# Patient Record
Sex: Male | Born: 1965 | Race: Black or African American | Hispanic: No | Marital: Single | Smoking: Current some day smoker
Health system: Southern US, Community
[De-identification: ages and names within clinical notes are randomized; demographics above are authoritative.]

## PROBLEM LIST (undated history)

## (undated) DIAGNOSIS — R51 Headache: Secondary | ICD-10-CM

## (undated) DIAGNOSIS — J45909 Unspecified asthma, uncomplicated: Secondary | ICD-10-CM

## (undated) DIAGNOSIS — G4730 Sleep apnea, unspecified: Secondary | ICD-10-CM

## (undated) HISTORY — PX: SHOULDER ARTHROSCOPY: SHX128

---

## 1998-05-10 ENCOUNTER — Emergency Department (HOSPITAL_COMMUNITY): Admission: EM | Admit: 1998-05-10 | Discharge: 1998-05-10 | Payer: Self-pay | Admitting: Emergency Medicine

## 1998-05-10 ENCOUNTER — Encounter: Payer: Self-pay | Admitting: Emergency Medicine

## 1998-05-11 ENCOUNTER — Encounter: Admission: RE | Admit: 1998-05-11 | Discharge: 1998-08-09 | Payer: Self-pay | Admitting: *Deleted

## 1998-06-06 ENCOUNTER — Encounter: Admission: RE | Admit: 1998-06-06 | Discharge: 1998-09-04 | Payer: Self-pay | Admitting: *Deleted

## 1999-09-10 ENCOUNTER — Emergency Department (HOSPITAL_COMMUNITY): Admission: EM | Admit: 1999-09-10 | Discharge: 1999-09-10 | Payer: Self-pay

## 1999-09-24 ENCOUNTER — Emergency Department (HOSPITAL_COMMUNITY): Admission: EM | Admit: 1999-09-24 | Discharge: 1999-09-25 | Payer: Self-pay | Admitting: Emergency Medicine

## 1999-10-08 ENCOUNTER — Emergency Department (HOSPITAL_COMMUNITY): Admission: EM | Admit: 1999-10-08 | Discharge: 1999-10-08 | Payer: Self-pay | Admitting: Emergency Medicine

## 1999-10-08 ENCOUNTER — Encounter: Payer: Self-pay | Admitting: Emergency Medicine

## 1999-10-09 ENCOUNTER — Emergency Department (HOSPITAL_COMMUNITY): Admission: EM | Admit: 1999-10-09 | Discharge: 1999-10-09 | Payer: Self-pay | Admitting: *Deleted

## 1999-10-25 ENCOUNTER — Emergency Department (HOSPITAL_COMMUNITY): Admission: EM | Admit: 1999-10-25 | Discharge: 1999-10-25 | Payer: Self-pay | Admitting: Emergency Medicine

## 1999-10-25 ENCOUNTER — Encounter: Payer: Self-pay | Admitting: Emergency Medicine

## 2000-09-09 ENCOUNTER — Emergency Department (HOSPITAL_COMMUNITY): Admission: EM | Admit: 2000-09-09 | Discharge: 2000-09-09 | Payer: Self-pay | Admitting: Emergency Medicine

## 2001-06-04 ENCOUNTER — Emergency Department (HOSPITAL_COMMUNITY): Admission: EM | Admit: 2001-06-04 | Discharge: 2001-06-04 | Payer: Self-pay | Admitting: Emergency Medicine

## 2001-06-04 ENCOUNTER — Encounter: Payer: Self-pay | Admitting: Emergency Medicine

## 2002-08-26 ENCOUNTER — Emergency Department (HOSPITAL_COMMUNITY): Admission: EM | Admit: 2002-08-26 | Discharge: 2002-08-27 | Payer: Self-pay | Admitting: Emergency Medicine

## 2003-12-09 ENCOUNTER — Emergency Department (HOSPITAL_COMMUNITY): Admission: EM | Admit: 2003-12-09 | Discharge: 2003-12-09 | Payer: Self-pay | Admitting: Emergency Medicine

## 2004-06-18 ENCOUNTER — Emergency Department (HOSPITAL_COMMUNITY): Admission: EM | Admit: 2004-06-18 | Discharge: 2004-06-18 | Payer: Self-pay | Admitting: Emergency Medicine

## 2004-10-02 ENCOUNTER — Emergency Department (HOSPITAL_COMMUNITY): Admission: EM | Admit: 2004-10-02 | Discharge: 2004-10-02 | Payer: Self-pay | Admitting: Emergency Medicine

## 2005-12-17 ENCOUNTER — Emergency Department (HOSPITAL_COMMUNITY): Admission: EM | Admit: 2005-12-17 | Discharge: 2005-12-17 | Payer: Self-pay | Admitting: Emergency Medicine

## 2010-02-09 ENCOUNTER — Ambulatory Visit (HOSPITAL_COMMUNITY): Admission: RE | Admit: 2010-02-09 | Discharge: 2010-02-09 | Payer: Self-pay | Admitting: Orthopedic Surgery

## 2010-08-06 ENCOUNTER — Emergency Department (HOSPITAL_COMMUNITY)
Admission: EM | Admit: 2010-08-06 | Discharge: 2010-08-06 | Disposition: A | Discharge status: Home / Self Care | Payer: Worker's Compensation | Attending: Emergency Medicine | Admitting: Emergency Medicine

## 2010-08-06 ENCOUNTER — Emergency Department (HOSPITAL_COMMUNITY): Payer: Worker's Compensation

## 2010-08-06 DIAGNOSIS — M25419 Effusion, unspecified shoulder: Secondary | ICD-10-CM | POA: Insufficient documentation

## 2010-08-06 DIAGNOSIS — IMO0002 Reserved for concepts with insufficient information to code with codable children: Secondary | ICD-10-CM | POA: Insufficient documentation

## 2010-08-06 DIAGNOSIS — M25519 Pain in unspecified shoulder: Secondary | ICD-10-CM | POA: Insufficient documentation

## 2010-08-06 DIAGNOSIS — M67919 Unspecified disorder of synovium and tendon, unspecified shoulder: Secondary | ICD-10-CM | POA: Insufficient documentation

## 2010-08-06 DIAGNOSIS — M719 Bursopathy, unspecified: Secondary | ICD-10-CM | POA: Insufficient documentation

## 2010-08-06 DIAGNOSIS — Y990 Civilian activity done for income or pay: Secondary | ICD-10-CM | POA: Insufficient documentation

## 2010-08-06 DIAGNOSIS — S46909A Unspecified injury of unspecified muscle, fascia and tendon at shoulder and upper arm level, unspecified arm, initial encounter: Secondary | ICD-10-CM | POA: Insufficient documentation

## 2010-08-06 DIAGNOSIS — S4980XA Other specified injuries of shoulder and upper arm, unspecified arm, initial encounter: Secondary | ICD-10-CM | POA: Insufficient documentation

## 2010-08-06 DIAGNOSIS — M25619 Stiffness of unspecified shoulder, not elsewhere classified: Secondary | ICD-10-CM | POA: Insufficient documentation

## 2010-09-26 ENCOUNTER — Encounter (HOSPITAL_BASED_OUTPATIENT_CLINIC_OR_DEPARTMENT_OTHER)
Admission: RE | Admit: 2010-09-26 | Discharge: 2010-09-26 | Disposition: A | Discharge status: Home / Self Care | Payer: Self-pay | Source: Ambulatory Visit | Attending: Orthopedic Surgery | Admitting: Orthopedic Surgery

## 2010-10-02 ENCOUNTER — Other Ambulatory Visit: Payer: Self-pay | Admitting: Orthopedic Surgery

## 2010-10-02 ENCOUNTER — Ambulatory Visit (HOSPITAL_BASED_OUTPATIENT_CLINIC_OR_DEPARTMENT_OTHER)
Admission: RE | Admit: 2010-10-02 | Discharge: 2010-10-03 | Disposition: A | Discharge status: Home / Self Care | Payer: Worker's Compensation | Source: Ambulatory Visit | Attending: Orthopedic Surgery | Admitting: Orthopedic Surgery

## 2010-10-02 DIAGNOSIS — Z01812 Encounter for preprocedural laboratory examination: Secondary | ICD-10-CM | POA: Insufficient documentation

## 2010-10-02 DIAGNOSIS — Z5333 Arthroscopic surgical procedure converted to open procedure: Secondary | ICD-10-CM | POA: Insufficient documentation

## 2010-10-02 DIAGNOSIS — Z01810 Encounter for preprocedural cardiovascular examination: Secondary | ICD-10-CM | POA: Insufficient documentation

## 2010-10-02 DIAGNOSIS — M719 Bursopathy, unspecified: Secondary | ICD-10-CM | POA: Insufficient documentation

## 2010-10-02 DIAGNOSIS — M67919 Unspecified disorder of synovium and tendon, unspecified shoulder: Secondary | ICD-10-CM | POA: Insufficient documentation

## 2010-10-02 LAB — POCT HEMOGLOBIN-HEMACUE: Hemoglobin: 14.1 g/dL (ref 13.0–17.0)

## 2010-10-04 NOTE — Op Note (Signed)
NAMEBILL, YOHN               ACCOUNT NO.:  [id number]  MEDICAL RECORD NO.:  [id number]           PATIENT TYPE:  E  LOCATION:  [facility]                         FACILITY:  [facility]  PHYSICIAN:  Katy Fitch. Makar Slatter, M.D. DATE OF BIRTH:  1965/12/10  DATE OF PROCEDURE:  10/02/2010 DATE OF DISCHARGE:  08/06/2010                              OPERATIVE REPORT   PREOPERATIVE DIAGNOSIS:  Chronic pain, right shoulder following injury, June 2011, with prior MRI documenting significant partial articular surface tear of right supraspinatus, a probable superior labrum anterior and posterior lesion, a marked stress reaction in the greater tuberosity and an unfavorable type 3 acromion.  POSTOPERATIVE DIAGNOSIS:  Chronic pain, right shoulder following injury, June 2011, with prior MRI documenting significant partial articular surface tear of right supraspinatus, a probable superior labrum anterior and posterior lesion, a marked stress reaction in the greater tuberosity and an unfavorable type 3 acromion.  Confirmation of type 2 superior labrum anterior and posterior lesion and instability of biceps origin and anterior superior glenohumeral ligament origin, full-thickness rotator cuff tear of central supraspinatus and partial articular surface tear of anterior supraspinatus and anterior infraspinatus with type 3 acromion.  OPERATIONS: 1. Examination of right shoulder under anesthesia confirming absence     of adhesive capsulitis. 2. Diagnostic arthroscopy of right shoulder confirming type 2 SLAP     lesion with frank instability of biceps origin, instability of     anterior superior glenohumeral ligament, moderate synovitis and the     aforementioned partial articular surface tendon avulsion with a     full-thickness tear of the supraspinatus. 3. Arthroscopic debridement of supraspinatus, infraspinatus, biceps     tenotomy. 4. Arthroscopic subacromial decompression with bursectomy,  coracoacromial ligament relaxation, and acromioplasty and     examination of the [facility] joint. 5. Open reconstruction of rotator cuff with a pair of supraspinatus     and infraspinatus to decorticated greater tuberosity with biopsy of     inflammatory area of greater tuberosity with bone erosion. 6. Open tenodesis of long head of biceps with suture to tissues at     bicipital groove and rotator cuff.  OPERATING SURGEON:  Katy Fitch. Winola Drum, MD  ASSISTANT:  Marveen Reeks Dasnoit, PA-C  ANESTHESIA:  General endotracheal supplemented by right interscalene block.  SUPERVISING ANESTHESIOLOGIST:  Kaylyn Layer. 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  He had had more than 9 months of treatment following an on-the-job injury working on his truck.  He had chronic pain and had findings compatible with a neck strain and a shoulder strain.  He was value by Dr. Darrelyn Hillock of [facility].  He had imaging of the shoulder which documented significant rotator cuff tendinopathy.  He was rehabilitated and released by Dr. Darrelyn Hillock with an impairment rating.  He had another injury to the shoulder in February 2012.  An alternative orthopedic opinion was requested.  Clinical examination suggested a significant rotator cuff injury, possible SLAP lesion and difficulties with impingement-type symptoms.  We advised that it would be in  Mr. Allston best interest to undergo diagnostic arthroscopy of the shoulder anticipating that he would have a possible SLAP lesion and a rotator cuff tear.  His preoperative imaging revealed a very substantial partial articular surface tear of the supraspinatus and an erosive lesion in the greater tuberosity with suggestion of a cyst in the greater tuberosity.  We anticipated subacromial decompression, repair of the rotator cuff and possible biceps  tenodesis.  Questions regarding the surgery were invited and answered in detail preoperatively.  PROCEDURE:  Lorena Clearman was interviewed by Dr. Michelle Piper in the holding area.  After detailed informed consent, a ropivacaine right interscalene block was placed without complication.  Mr. Daniel was brought to room #6 of the [facility] and placed in supine position on the operating table.  Under Dr. Deirdre Priest direct supervision, general endotracheal anesthesia was induced.  Mr. Danielski was positioned in the beach-chair position with aid of a torso and head holder designed from shoulder arthroscopy.  The entire right upper extremity and forequarter were prepped with DuraPrep and draped with impervious arthroscopy drapes.  The procedure commenced with examination of the shoulder under anesthesia.  This confirmed the absence of adhesive capsulitis.  A routine surgical time-out was accomplished followed by instrumentation of the rotation shoulder through a standard posterior viewing portal with a blunt trocar.  Diagnostic arthroscopy confirmed the presence of a type 2 SLAP lesion with a positive peel-back sign greater than 1 cm. The anterior superior glenohumeral ligament origin was completely unstable.  The middle glenohumeral ligament and inferior glenohumeral ligament were intact.  The deep surface of the rotator cuff revealed a significant pars lesion of more than 50% of the anterior supraspinatus, 100% of the mid supraspinatus and 50% of the anterior infraspinatus. This was documented with digital camera followed by use of a suction shaver to debride this to a stable margin.  We elected to perform a biceps tenodesis.  Therefore, the biceps was released 95% at its origin of superior labrum with a suction shaver followed by removal of the arthroscopic equipment and instrumentation of the subacromial space. The bursa was hypertrophic.  A suction shaver was used to debride  bursa to allowing visualization of the [facility] joint.  The distal clavicle was not prominent.  There was some synovitis present, that was debrided.  There was a type 3 acromion.  The coracoacromial ligament was released with cutting cautery followed by use of a suction shaver to level the acromion to type 1 morphology.  The cuff had a small full-thickness tear visualized.  At this point, hemostasis was achieved followed by removal of the arthroscopic equipment.  A 5-cm anterior middle third deltoid muscle splitting incision was accomplished followed by bursectomy.  The tear was easily identified.  A 3.5-cm insertion of the supraspinatus and anterior infraspinatus was elevated, the necrotic tendon debrided, a bur was used to decorticate the greater tuberosity down to bleeding surface. The biceps was identified and tenodesis to the surrounding rotator cuff and intertubercular ligament with a series of mattress sutures of #2 FiberWire knots buried.  The rotator cuff was advanced laterally to an anatomic position and repaired with a central fiber tape and a pair of mattress sutures with a medial corkscrew creating an anatomic footprint.  The repair was completed with lateral swivel lock.  A low profile repair was achieved. A hand rasp was used to feather the lateral margin of the acromion followed by hemostasis.  The deltoid split was repaired with simple suture of 0 Vicryl  followed by repair of the skin with subcutaneous 0 and 2-0 Vicryl and intradermal 3-0 Prolene.  Attention was then directed to re-scoping in the shoulder joint.  The scope was then the reintroduced allowing visualization of the complete repair.  An anatomic footprint was achieved followed by completion of the biceps tenotomy.  The stump of the biceps was debrided to the margin of the rotator cuff with a 5.5-mm suction shaver.  There were no apparent complications.  Mr. Ong tolerated the surgery and anesthesia well.   He was placed in a sling, transferred to the recovery room with stable signs with anticipation of an admission to recovery care center for observation his vital signs and appropriate pain management with p.o. and IV Dilaudid as well as IV Ancef 1 g IV q.8 h x3 doses as a prophylactic antibiotic.     Katy Fitch Mustapha Colson, M.D.     RVS/MEDQ  D:  10/02/2010  T:  10/03/2010  Job:  [id number]  Electronically Signed by Josephine Igo M.D. on 10/04/2010 12:13:52 PM

## 2011-05-29 ENCOUNTER — Encounter (HOSPITAL_BASED_OUTPATIENT_CLINIC_OR_DEPARTMENT_OTHER): Payer: Self-pay | Admitting: *Deleted

## 2011-05-29 NOTE — Progress Notes (Signed)
Pt waiting to see if work comp going to pay-this is his 3 surg in a yr

## 2011-05-30 ENCOUNTER — Other Ambulatory Visit: Payer: Self-pay | Admitting: Orthopedic Surgery

## 2011-06-04 ENCOUNTER — Encounter (HOSPITAL_BASED_OUTPATIENT_CLINIC_OR_DEPARTMENT_OTHER): Admission: RE | Payer: Self-pay | Source: Ambulatory Visit

## 2011-06-04 ENCOUNTER — Ambulatory Visit (HOSPITAL_BASED_OUTPATIENT_CLINIC_OR_DEPARTMENT_OTHER)
Admission: RE | Admit: 2011-06-04 | Payer: Worker's Compensation | Source: Ambulatory Visit | Admitting: Orthopedic Surgery

## 2011-06-04 HISTORY — DX: Headache: R51

## 2011-06-04 SURGERY — ARTHROSCOPY, SHOULDER
Anesthesia: General | Laterality: Right

## 2011-12-17 ENCOUNTER — Encounter (HOSPITAL_COMMUNITY): Payer: Self-pay | Admitting: Emergency Medicine

## 2011-12-17 ENCOUNTER — Emergency Department (HOSPITAL_COMMUNITY)
Admission: EM | Admit: 2011-12-17 | Discharge: 2011-12-18 | Discharge status: Against Medical Advice | Payer: No Typology Code available for payment source | Attending: Emergency Medicine | Admitting: Emergency Medicine

## 2011-12-17 DIAGNOSIS — M542 Cervicalgia: Secondary | ICD-10-CM | POA: Insufficient documentation

## 2011-12-17 DIAGNOSIS — R51 Headache: Secondary | ICD-10-CM | POA: Insufficient documentation

## 2011-12-17 DIAGNOSIS — Z043 Encounter for examination and observation following other accident: Secondary | ICD-10-CM | POA: Insufficient documentation

## 2011-12-17 DIAGNOSIS — M25519 Pain in unspecified shoulder: Secondary | ICD-10-CM | POA: Insufficient documentation
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
## 2011-12-18 ENCOUNTER — Emergency Department (HOSPITAL_COMMUNITY): Payer: No Typology Code available for payment source

## 2011-12-18 ENCOUNTER — Emergency Department (HOSPITAL_COMMUNITY)
Admission: EM | Admit: 2011-12-18 | Discharge: 2011-12-18 | Disposition: A | Discharge status: Home / Self Care | Payer: No Typology Code available for payment source | Attending: Emergency Medicine | Admitting: Emergency Medicine

## 2011-12-18 ENCOUNTER — Encounter (HOSPITAL_COMMUNITY): Payer: Self-pay | Admitting: Emergency Medicine

## 2011-12-18 DIAGNOSIS — M542 Cervicalgia: Secondary | ICD-10-CM | POA: Insufficient documentation

## 2011-12-18 DIAGNOSIS — S139XXA Sprain of joints and ligaments of unspecified parts of neck, initial encounter: Secondary | ICD-10-CM | POA: Insufficient documentation

## 2011-12-18 DIAGNOSIS — S134XXA Sprain of ligaments of cervical spine, initial encounter: Secondary | ICD-10-CM

## 2011-12-18 DIAGNOSIS — S43109A Unspecified dislocation of unspecified acromioclavicular joint, initial encounter: Secondary | ICD-10-CM | POA: Insufficient documentation

## 2011-12-18 DIAGNOSIS — F17200 Nicotine dependence, unspecified, uncomplicated: Secondary | ICD-10-CM | POA: Insufficient documentation

## 2011-12-18 DIAGNOSIS — Z8739 Personal history of other diseases of the musculoskeletal system and connective tissue: Secondary | ICD-10-CM | POA: Insufficient documentation

## 2011-12-18 DIAGNOSIS — Y92410 Unspecified street and highway as the place of occurrence of the external cause: Secondary | ICD-10-CM | POA: Insufficient documentation

## 2011-12-18 MED ORDER — DIAZEPAM 5 MG PO TABS
5.0000 mg | ORAL_TABLET | Freq: Once | ORAL | Status: AC
  Administered 2011-12-18: 5 mg via ORAL
  Filled 2011-12-18: qty 1

## 2011-12-18 MED ORDER — DIAZEPAM 5 MG PO TABS: 5.0000 mg | ORAL_TABLET | Freq: Three times a day (TID) | ORAL | Status: AC | PRN

## 2011-12-18 MED ORDER — HYDROCODONE-ACETAMINOPHEN 5-325 MG PO TABS: 1.0000 | ORAL_TABLET | ORAL | Status: AC | PRN

## 2011-12-18 MED ORDER — NAPROXEN 375 MG PO TABS: 375.0000 mg | ORAL_TABLET | Freq: Two times a day (BID) | ORAL | Status: DC

## 2011-12-18 NOTE — ED Notes (Signed)
MD at bedside. 

## 2011-12-18 NOTE — ED Provider Notes (Signed)
Medical screening examination/treatment/procedure(s) were performed by non-physician practitioner and as supervising physician I was immediately available for consultation/collaboration.  Ethelda Chick, MD 12/18/11 2135

## 2011-12-18 NOTE — ED Notes (Addendum)
Pt stated that he has to be at work at 6 and cant wait any longer. Pt decided to leave.
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
## 2012-02-13 ENCOUNTER — Encounter (HOSPITAL_COMMUNITY): Payer: Self-pay | Admitting: Emergency Medicine

## 2012-02-13 ENCOUNTER — Emergency Department (HOSPITAL_COMMUNITY)
Admission: EM | Admit: 2012-02-13 | Discharge: 2012-02-13 | Disposition: A | Discharge status: Home / Self Care | Payer: No Typology Code available for payment source | Attending: Emergency Medicine | Admitting: Emergency Medicine

## 2012-02-13 DIAGNOSIS — M25519 Pain in unspecified shoulder: Secondary | ICD-10-CM | POA: Insufficient documentation

## 2012-02-13 MED ORDER — OXYCODONE-ACETAMINOPHEN 5-325 MG PO TABS
1.0000 | ORAL_TABLET | Freq: Once | ORAL | Status: AC
  Administered 2012-02-13: 1 via ORAL
  Filled 2012-02-13: qty 1

## 2012-02-13 MED ORDER — OXYCODONE-ACETAMINOPHEN 5-325 MG PO TABS: ORAL_TABLET | ORAL | Status: DC

## 2012-02-13 NOTE — ED Provider Notes (Signed)
Medical screening examination/treatment/procedure(s) were performed by non-physician practitioner and as supervising physician I was immediately available for consultation/collaboration.   Gwyneth Sprout, MD 02/13/12 [phone]
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
## 2012-03-05 ENCOUNTER — Emergency Department (HOSPITAL_COMMUNITY)
Admission: EM | Admit: 2012-03-05 | Discharge: 2012-03-05 | Discharge status: Against Medical Advice | Payer: No Typology Code available for payment source

## 2012-03-11 ENCOUNTER — Other Ambulatory Visit: Payer: Self-pay | Admitting: Orthopedic Surgery

## 2012-03-11 ENCOUNTER — Other Ambulatory Visit (HOSPITAL_COMMUNITY): Payer: Self-pay | Admitting: Orthopedic Surgery

## 2012-03-11 DIAGNOSIS — R609 Edema, unspecified: Secondary | ICD-10-CM

## 2012-03-11 DIAGNOSIS — M25511 Pain in right shoulder: Secondary | ICD-10-CM

## 2012-03-14 ENCOUNTER — Ambulatory Visit (HOSPITAL_COMMUNITY)
Admission: RE | Admit: 2012-03-14 | Discharge: 2012-03-14 | Disposition: A | Discharge status: Home / Self Care | Payer: Self-pay | Source: Ambulatory Visit | Attending: Orthopedic Surgery | Admitting: Orthopedic Surgery

## 2012-03-14 DIAGNOSIS — M8560 Other cyst of bone, unspecified site: Secondary | ICD-10-CM | POA: Insufficient documentation

## 2012-03-14 DIAGNOSIS — M25519 Pain in unspecified shoulder: Secondary | ICD-10-CM | POA: Insufficient documentation

## 2012-03-14 DIAGNOSIS — M25511 Pain in right shoulder: Secondary | ICD-10-CM

## 2012-03-14 DIAGNOSIS — M6740 Ganglion, unspecified site: Secondary | ICD-10-CM | POA: Insufficient documentation

## 2012-03-14 DIAGNOSIS — R609 Edema, unspecified: Secondary | ICD-10-CM

## 2012-03-25 ENCOUNTER — Other Ambulatory Visit: Payer: Self-pay | Admitting: Orthopedic Surgery

## 2012-03-31 ENCOUNTER — Encounter (HOSPITAL_BASED_OUTPATIENT_CLINIC_OR_DEPARTMENT_OTHER): Payer: Self-pay | Admitting: *Deleted
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
## 2012-04-03 ENCOUNTER — Ambulatory Visit (HOSPITAL_BASED_OUTPATIENT_CLINIC_OR_DEPARTMENT_OTHER)
Admission: RE | Admit: 2012-04-03 | Discharge: 2012-04-03 | Disposition: A | Discharge status: Home / Self Care | Payer: No Typology Code available for payment source | Source: Ambulatory Visit | Attending: Orthopedic Surgery | Admitting: Orthopedic Surgery

## 2012-04-03 ENCOUNTER — Ambulatory Visit (HOSPITAL_BASED_OUTPATIENT_CLINIC_OR_DEPARTMENT_OTHER): Payer: No Typology Code available for payment source | Admitting: Anesthesiology

## 2012-04-03 ENCOUNTER — Encounter (HOSPITAL_BASED_OUTPATIENT_CLINIC_OR_DEPARTMENT_OTHER): Payer: Self-pay | Admitting: Anesthesiology

## 2012-04-03 ENCOUNTER — Encounter (HOSPITAL_BASED_OUTPATIENT_CLINIC_OR_DEPARTMENT_OTHER)
Admission: RE | Disposition: A | Discharge status: Home / Self Care | Payer: Self-pay | Source: Ambulatory Visit | Attending: Orthopedic Surgery

## 2012-04-03 ENCOUNTER — Encounter (HOSPITAL_BASED_OUTPATIENT_CLINIC_OR_DEPARTMENT_OTHER): Payer: Self-pay | Admitting: *Deleted

## 2012-04-03 DIAGNOSIS — M67439 Ganglion, unspecified wrist: Secondary | ICD-10-CM

## 2012-04-03 DIAGNOSIS — M6740 Ganglion, unspecified site: Secondary | ICD-10-CM | POA: Insufficient documentation

## 2012-04-03 DIAGNOSIS — M19019 Primary osteoarthritis, unspecified shoulder: Secondary | ICD-10-CM | POA: Insufficient documentation

## 2012-04-03 DIAGNOSIS — G4730 Sleep apnea, unspecified: Secondary | ICD-10-CM | POA: Insufficient documentation

## 2012-04-03 HISTORY — PX: SHOULDER OPEN ROTATOR CUFF REPAIR: SHX2407

## 2012-04-03 HISTORY — DX: Sleep apnea, unspecified: G47.30

## 2012-04-03 HISTORY — PX: GANGLION CYST EXCISION: SHX1691

## 2012-04-03 LAB — POCT HEMOGLOBIN-HEMACUE: Hemoglobin: 14.4 g/dL (ref 13.0–17.0)

## 2012-04-03 SURGERY — REPAIR, ROTATOR CUFF, OPEN
Anesthesia: General | Site: Wrist | Laterality: Right | Wound class: Clean

## 2012-04-03 MED ORDER — DEXAMETHASONE SODIUM PHOSPHATE 4 MG/ML IJ SOLN
INTRAMUSCULAR | Status: DC | PRN
  Administered 2012-04-03: 10 mg via INTRAVENOUS

## 2012-04-03 MED ORDER — MEPERIDINE HCL 25 MG/ML IJ SOLN
6.2500 mg | INTRAMUSCULAR | Status: DC | PRN
  Filled 2012-04-03: qty 1

## 2012-04-03 MED ORDER — SUCCINYLCHOLINE CHLORIDE 20 MG/ML IJ SOLN
INTRAMUSCULAR | Status: DC | PRN
  Administered 2012-04-03: 100 mg via INTRAVENOUS
  Administered 2012-04-03: 200 mg via INTRAVENOUS

## 2012-04-03 MED ORDER — ROPIVACAINE HCL 5 MG/ML IJ SOLN
INTRAMUSCULAR | Status: DC | PRN
  Administered 2012-04-03: 30 mL

## 2012-04-03 MED ORDER — KETOROLAC TROMETHAMINE 10 MG PO TABS: 10.0000 mg | ORAL_TABLET | Freq: Four times a day (QID) | ORAL | Status: DC | PRN

## 2012-04-03 MED ORDER — FENTANYL CITRATE 0.05 MG/ML IJ SOLN
INTRAMUSCULAR | Status: DC | PRN
  Administered 2012-04-03: 50 ug via INTRAVENOUS

## 2012-04-03 MED ORDER — FENTANYL CITRATE 0.05 MG/ML IJ SOLN
100.0000 ug | Freq: Once | INTRAMUSCULAR | Status: AC
  Administered 2012-04-03: 100 ug via INTRAVENOUS
  Filled 2012-04-03: qty 2

## 2012-04-03 MED ORDER — PROMETHAZINE HCL 25 MG/ML IJ SOLN
6.2500 mg | INTRAMUSCULAR | Status: DC | PRN
  Filled 2012-04-03: qty 1

## 2012-04-03 MED ORDER — PROPOFOL 10 MG/ML IV BOLUS
INTRAVENOUS | Status: DC | PRN
  Administered 2012-04-03: 280 mg via INTRAVENOUS

## 2012-04-03 MED ORDER — POVIDONE-IODINE 7.5 % EX SOLN
Freq: Once | CUTANEOUS | Status: DC
  Filled 2012-04-03: qty 118

## 2012-04-03 MED ORDER — CEFAZOLIN SODIUM-DEXTROSE 2-3 GM-% IV SOLR
2.0000 g | INTRAVENOUS | Status: DC
  Filled 2012-04-03: qty 50

## 2012-04-03 MED ORDER — CEPHALEXIN 500 MG PO CAPS: 500.0000 mg | ORAL_CAPSULE | Freq: Four times a day (QID) | ORAL | Status: DC

## 2012-04-03 MED ORDER — MIDAZOLAM HCL 2 MG/2ML IJ SOLN
2.0000 mg | Freq: Once | INTRAMUSCULAR | Status: AC
  Administered 2012-04-03: 2 mg via INTRAVENOUS
  Filled 2012-04-03: qty 2

## 2012-04-03 MED ORDER — LIDOCAINE HCL (CARDIAC) 20 MG/ML IV SOLN
INTRAVENOUS | Status: DC | PRN
  Administered 2012-04-03: 50 mg via INTRAVENOUS

## 2012-04-03 MED ORDER — ONDANSETRON HCL 4 MG/2ML IJ SOLN
INTRAMUSCULAR | Status: DC | PRN
  Administered 2012-04-03: 4 mg via INTRAVENOUS

## 2012-04-03 MED ORDER — LACTATED RINGERS IV SOLN
INTRAVENOUS | Status: DC
  Administered 2012-04-03: 100 mL/h via INTRAVENOUS
  Administered 2012-04-03 (×2): via INTRAVENOUS
  Filled 2012-04-03: qty 1000

## 2012-04-03 MED ORDER — OXYCODONE-ACETAMINOPHEN 7.5-325 MG PO TABS: 1.0000 | ORAL_TABLET | ORAL | Status: DC | PRN

## 2012-04-03 MED ORDER — FENTANYL CITRATE 0.05 MG/ML IJ SOLN
25.0000 ug | INTRAMUSCULAR | Status: DC | PRN
  Filled 2012-04-03: qty 1

## 2012-04-03 MED ORDER — SODIUM CHLORIDE 0.9 % IR SOLN
Status: DC | PRN
  Administered 2012-04-03: 500 mL

## 2012-04-03 MED ORDER — LACTATED RINGERS IV SOLN
INTRAVENOUS | Status: DC
  Filled 2012-04-03: qty 1000

## 2012-04-03 SURGICAL SUPPLY — 87 items
BANDAGE CONFORM 3  STR LF (GAUZE/BANDAGES/DRESSINGS) ×3 IMPLANT
BANDAGE ELASTIC 3 VELCRO ST LF (GAUZE/BANDAGES/DRESSINGS) ×3 IMPLANT
BANDAGE ELASTIC 4 VELCRO ST LF (GAUZE/BANDAGES/DRESSINGS) IMPLANT
BENZOIN TINCTURE PRP APPL 2/3 (GAUZE/BANDAGES/DRESSINGS) ×3 IMPLANT
BLADE FLAT COURSE (BLADE) ×3 IMPLANT
BLADE SURG 10 STRL SS (BLADE) ×3 IMPLANT
BLADE SURG 15 STRL LF DISP TIS (BLADE) ×2 IMPLANT
BLADE SURG 15 STRL SS (BLADE) ×1
BNDG ESMARK 4X9 LF (GAUZE/BANDAGES/DRESSINGS) ×3 IMPLANT
CANISTER SUCTION 1200CC (MISCELLANEOUS) ×3 IMPLANT
CLEANER CAUTERY TIP 5X5 PAD (MISCELLANEOUS) IMPLANT
CLOTH BEACON ORANGE TIMEOUT ST (SAFETY) ×3 IMPLANT
CORDS BIPOLAR (ELECTRODE) ×3 IMPLANT
COVER TABLE BACK 60X90 (DRAPES) ×3 IMPLANT
DRAPE EXTREMITY T 121X128X90 (DRAPE) ×3 IMPLANT
DRAPE INCISE IOBAN 66X45 STRL (DRAPES) IMPLANT
DRAPE LG THREE QUARTER DISP (DRAPES) ×6 IMPLANT
DRAPE SHOULDER BEACH CHAIR (DRAPES) ×3 IMPLANT
DRAPE U-SHAPE 47X51 STRL (DRAPES) ×3 IMPLANT
DRSG EMULSION OIL 3X3 NADH (GAUZE/BANDAGES/DRESSINGS) ×3 IMPLANT
DRSG PAD ABDOMINAL 8X10 ST (GAUZE/BANDAGES/DRESSINGS) ×3 IMPLANT
DURAPREP 26ML APPLICATOR (WOUND CARE) ×3 IMPLANT
ELECT REM PT RETURN 9FT ADLT (ELECTROSURGICAL) ×3
ELECTRODE REM PT RTRN 9FT ADLT (ELECTROSURGICAL) ×2 IMPLANT
GLOVE BIO SURGEON STRL SZ 6.5 (GLOVE) ×3 IMPLANT
GLOVE BIOGEL PI IND STRL 8 (GLOVE) ×2 IMPLANT
GLOVE BIOGEL PI INDICATOR 8 (GLOVE) ×1
GLOVE ECLIPSE 6.0 STRL STRAW (GLOVE) ×3 IMPLANT
GLOVE ECLIPSE 7.0 STRL STRAW (GLOVE) ×3 IMPLANT
GLOVE ECLIPSE 8.0 STRL XLNG CF (GLOVE) ×3 IMPLANT
GLOVE INDICATOR 7.0 STRL GRN (GLOVE) ×3 IMPLANT
GLOVE INDICATOR 8.0 STRL GRN (GLOVE) ×3 IMPLANT
GOWN PREVENTION PLUS LG XLONG (DISPOSABLE) ×6 IMPLANT
GOWN STRL NON-REIN LRG LVL3 (GOWN DISPOSABLE) ×3 IMPLANT
GOWN STRL REIN XL XLG (GOWN DISPOSABLE) ×3 IMPLANT
MARKER SKIN DUAL TIP RULER LAB (MISCELLANEOUS) ×3 IMPLANT
NEEDLE 1/2 CIR CATGUT .05X1.09 (NEEDLE) IMPLANT
NEEDLE HYPO 22GX1.5 SAFETY (NEEDLE) IMPLANT
NEEDLE HYPO 25X1 1.5 SAFETY (NEEDLE) IMPLANT
NS IRRIG 500ML POUR BTL (IV SOLUTION) ×3 IMPLANT
PACK ARTHROSCOPY DSU (CUSTOM PROCEDURE TRAY) ×3 IMPLANT
PACK BASIN DAY SURGERY FS (CUSTOM PROCEDURE TRAY) ×3 IMPLANT
PAD CAST 3X4 CTTN HI CHSV (CAST SUPPLIES) ×2 IMPLANT
PAD CLEANER CAUTERY TIP 5X5 (MISCELLANEOUS)
PADDING CAST ABS 4INX4YD NS (CAST SUPPLIES) ×1
PADDING CAST ABS COTTON 4X4 ST (CAST SUPPLIES) ×2 IMPLANT
PADDING CAST COTTON 3X4 STRL (CAST SUPPLIES) ×1
PENCIL BUTTON HOLSTER BLD 10FT (ELECTRODE) ×3 IMPLANT
SLING ARM IMMOBILIZER LRG (SOFTGOODS) ×3 IMPLANT
SLING ARM IMMOBILIZER MED (SOFTGOODS) IMPLANT
SPLINT PLASTER CAST XFAST 3X15 (CAST SUPPLIES) ×10 IMPLANT
SPLINT PLASTER CAST XFAST 4X15 (CAST SUPPLIES) ×20 IMPLANT
SPLINT PLASTER XTRA FAST SET 4 (CAST SUPPLIES) ×10
SPLINT PLASTER XTRA FASTSET 3X (CAST SUPPLIES) ×5
SPONGE GAUZE 4X4 12PLY (GAUZE/BANDAGES/DRESSINGS) ×3 IMPLANT
SPONGE LAP 4X18 X RAY DECT (DISPOSABLE) ×3 IMPLANT
SPONGE SURGIFOAM ABS GEL 100 (HEMOSTASIS) ×3 IMPLANT
STAPLER VISISTAT (STAPLE) ×3 IMPLANT
STOCKINETTE 4X48 STRL (DRAPES) ×3 IMPLANT
STRIP CLOSURE SKIN 1/2X4 (GAUZE/BANDAGES/DRESSINGS) ×3 IMPLANT
SUCTION FRAZIER TIP 10 FR DISP (SUCTIONS) ×3 IMPLANT
SUT BONE WAX W31G (SUTURE) ×3 IMPLANT
SUT ETHIBOND GREEN BRAID 0S 4 (SUTURE) IMPLANT
SUT ETHILON 4 0 PS 2 18 (SUTURE) ×3 IMPLANT
SUT VIC AB 0 CT1 36 (SUTURE) ×3 IMPLANT
SUT VIC AB 1 CT1 36 (SUTURE) ×6 IMPLANT
SUT VIC AB 2-0 CT1 27 (SUTURE) ×1
SUT VIC AB 2-0 CT1 TAPERPNT 27 (SUTURE) ×2 IMPLANT
SUT VIC AB 2-0 SH 27 (SUTURE)
SUT VIC AB 2-0 SH 27XBRD (SUTURE) IMPLANT
SUT VIC AB 2-0 UR6 27 (SUTURE) IMPLANT
SUT VIC AB 3-0 CT1 27 (SUTURE)
SUT VIC AB 3-0 CT1 TAPERPNT 27 (SUTURE) IMPLANT
SUT VIC AB 3-0 PS2 18 (SUTURE)
SUT VIC AB 3-0 PS2 18XBRD (SUTURE) IMPLANT
SUT VIC AB 3-0 SH 27 (SUTURE) ×1
SUT VIC AB 3-0 SH 27X BRD (SUTURE) ×2 IMPLANT
SUT VIC AB 4-0 SH 27 (SUTURE)
SUT VIC AB 4-0 SH 27XANBCTRL (SUTURE) IMPLANT
SUT VICRYL 4-0 PS2 18IN ABS (SUTURE) IMPLANT
SYR BULB IRRIGATION 50ML (SYRINGE) ×3 IMPLANT
SYR CONTROL 10ML LL (SYRINGE) IMPLANT
TAPE CLOTH SURG 6X10 WHT LF (GAUZE/BANDAGES/DRESSINGS) ×3 IMPLANT
TAPE HYPAFIX 6X30 (GAUZE/BANDAGES/DRESSINGS) IMPLANT
TOWEL OR 17X24 6PK STRL BLUE (TOWEL DISPOSABLE) ×6 IMPLANT
UNDERPAD 30X30 INCONTINENT (UNDERPADS AND DIAPERS) ×3 IMPLANT
WATER STERILE IRR 500ML POUR (IV SOLUTION) ×3 IMPLANT
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
## 2012-04-03 NOTE — Brief Op Note (Signed)
04/03/2012  5:54 PM  PATIENT:  Christopher Hebert  46 y.o. male  PRE-OPERATIVE DIAGNOSIS:  painful ganglion dorsum right wrist large ganglion cyst at acromiclavicular joint right shoulder  POST-OPERATIVE DIAGNOSIS:  painful ganglion dorsum right wrist large ganglion cyst at acromiclavicular joint right shoulder  PROCEDURE:  Procedure(s) (LRB) with comments: ROTATOR CUFF REPAIR SHOULDER OPEN (Right) - RIGHT SHOULDER OPEN DISTAL CLAVICLE RESECTION AND RIGHT WRIST EXCISION OF GANGLION CYST REMOVAL GANGLION OF WRIST (Right)  SURGEON:  Surgeon(s) and Role:    * Drucilla Schmidt, MD - Primary  PHYSICIAN ASSISTANT:   ASSISTANTS: nurse   ANESTHESIA:   regional and general  EBL:  Total I/O In: 1300 [I.V.:1300] Out: -   BLOOD ADMINISTERED:none  DRAINS: none   LOCAL MEDICATIONS USED:  NONE  SPECIMEN:  Excision  DISPOSITION OF SPECIMEN:  PATHOLOGY  COUNTS:  YES  TOURNIQUET:   Total Tourniquet Time Documented: Upper Arm (Right) - 37 minutes  DICTATION: .Other Dictation: Dictation Number [id number]  PLAN OF CARE: Discharge to home after PACU  PATIENT DISPOSITION:  PACU - hemodynamically stable.   Delay start of Pharmacological VTE agent (>24hrs) due to surgical blood loss or risk of bleeding: not applicable

## 2012-04-03 NOTE — Anesthesia Preprocedure Evaluation (Addendum)
Anesthesia Evaluation  Patient identified by MRN, date of birth, ID band Patient awake    Reviewed: Allergy & Precautions, H&P , NPO status , Patient's Chart, lab work & pertinent test results  Airway Mallampati: II TM Distance: >3 FB Neck ROM: Full    Dental No notable dental hx.    Pulmonary neg pulmonary ROS, sleep apnea ,  breath sounds clear to auscultation  Pulmonary exam normal       Cardiovascular negative cardio ROS  Rhythm:Regular Rate:Normal     Neuro/Psych negative neurological ROS  negative psych ROS   GI/Hepatic negative GI ROS, Neg liver ROS,   Endo/Other  negative endocrine ROS  Renal/GU negative Renal ROS  negative genitourinary   Musculoskeletal negative musculoskeletal ROS (+)   Abdominal   Peds negative pediatric ROS (+)  Hematology negative hematology ROS (+)   Anesthesia Other Findings   Reproductive/Obstetrics negative OB ROS                          Anesthesia Physical Anesthesia Plan  ASA: II  Anesthesia Plan: General   Post-op Pain Management:    Induction: Intravenous  Airway Management Planned: Oral ETT  Additional Equipment:   Intra-op Plan:   Post-operative Plan: Extubation in OR  Informed Consent: I have reviewed the patients History and Physical, chart, labs and discussed the procedure including the risks, benefits and alternatives for the proposed anesthesia with the patient or authorized representative who has indicated his/her understanding and acceptance.   Dental advisory given  Plan Discussed with: CRNA  Anesthesia Plan Comments:         Anesthesia Quick Evaluation

## 2012-04-03 NOTE — Anesthesia Postprocedure Evaluation (Signed)
  Anesthesia Post-op Note  Patient: Christopher Hebert  Procedure(s) Performed: Procedure(s) (LRB): ROTATOR CUFF REPAIR SHOULDER OPEN (Right) REMOVAL GANGLION OF WRIST (Right)  Patient Location: PACU  Anesthesia Type: GA combined with regional for post-op pain  Level of Consciousness: awake and alert   Airway and Oxygen Therapy: Patient Spontanous Breathing  Post-op Pain: mild  Post-op Assessment: Post-op Vital signs reviewed, Patient's Cardiovascular Status Stable, Respiratory Function Stable, Patent Airway and No signs of Nausea or vomiting  Post-op Vital Signs: stable  Complications: No apparent anesthesia complications

## 2012-04-03 NOTE — Transfer of Care (Signed)
Immediate Anesthesia Transfer of Care Note  Patient: [name]  Procedure(s) Performed: Procedure(s) (LRB): ROTATOR CUFF REPAIR SHOULDER OPEN (Right) REMOVAL GANGLION OF WRIST (Right)  Patient Location: Patient transported to PACU with oxygen via face mask at 4 Liters / Min  Anesthesia Type: General  Level of Consciousness: awake and alert   Airway & Oxygen Therapy: Patient Spontanous Breathing and Patient connected to face mask oxygen  Post-op Assessment: Report given to PACU RN and Post -op Vital signs reviewed and stable  Post vital signs: Reviewed and stable  Dentition: Teeth and oropharynx remain in pre-op condition  Complications: No apparent anesthesia complications

## 2012-04-03 NOTE — Anesthesia Procedure Notes (Addendum)
Anesthesia Regional Block:  Supraclavicular block  Pre-Anesthetic Checklist: ,, timeout performed, Correct Patient, Correct Site, Correct Laterality, Correct Procedure, Correct Position, site marked, Risks and benefits discussed,  Surgical consent,  Pre-op evaluation,  At surgeon's request and post-op pain management  Laterality: Right and Upper  Prep: chloraprep       Needles:  Injection technique: Single-shot  Needle Type: Stimiplex     Needle Length: 10cm 10 cm     Additional Needles:  Procedures: ultrasound guided (picture in chart) and nerve stimulator Supraclavicular block Narrative:  Start time: 04/03/2012 2:30 PM Injection made incrementally with aspirations every 5 mL.  Performed by: Personally  Anesthesiologist: Phillips Grout MD  Additional Notes: Risks, benefits and alternative to block explained extensively.  Patient tolerated procedure well, without complications.  Supraclavicular block Procedure Name: Intubation Date/Time: 04/03/2012 4:11 PM Performed by: Fran Lowes Pre-anesthesia Checklist: Patient identified, Emergency Drugs available, Suction available and Patient being monitored Patient Re-evaluated:Patient Re-evaluated prior to inductionOxygen Delivery Method: Circle System Utilized Preoxygenation: Pre-oxygenation with 100% oxygen Intubation Type: IV induction Ventilation: Two handed mask ventilation required Laryngoscope Size: Mac and 4 Grade View: Grade II Tube type: Oral Tube size: 8.0 mm Number of attempts: 2 Airway Equipment and Method: stylet,  oral airway and Bougie stylet Placement Confirmation: ETT inserted through vocal cords under direct vision,  positive ETCO2 and breath sounds checked- equal and bilateral Secured at: 23 cm Tube secured with: Tape Dental Injury: Teeth and Oropharynx as per pre-operative assessment  Comments: 1st attempt pt not relaxed. 2nd by dr. Okey Dupre

## 2012-04-06 NOTE — Op Note (Signed)
NAMELIMUEL, Christopher Hebert               ACCOUNT NO.:  [id number]  MEDICAL RECORD NO.:  [id number]  LOCATION:                                 FACILITY:  PHYSICIAN:  Marlowe Kays, M.D.  DATE OF BIRTH:  05-26-1966  DATE OF PROCEDURE:  04/03/2012 DATE OF DISCHARGE:                              OPERATIVE REPORT   PREOPERATIVE DIAGNOSES: 1. Traumatic ganglion cyst with osteoarthritis, acromioclavicular     joint, right shoulder. 2. Traumatic ganglion dorsum, right wrist.  POSTOPERATIVE DIAGNOSES: 1. Traumatic ganglion cyst with osteoarthritis, acromioclavicular     joint, right shoulder. 2. Traumatic ganglion dorsum, right wrist.  OPERATION: 1. Open resection, distal right clavicle with resection of cystic     material. 2. Open excision of ganglion dorsum, right wrist.  SURGEON:  Marlowe Kays, MD  ASSISTANT:  Nurse.  ANESTHESIA:  General.  PATHOLOGY AND JUSTIFICATION FOR PROCEDURE:  This is a liability__________ case where he injured both the right shoulder and the right wrist in the accident. He has had an MRI of the right shoulder which has demonstrated no rotator cuff tear and abnormality with the biceps tendon, but the [facility] joint was quite arthritic, swollen with what appeared to be cystic type material there.  PROCEDURE:  Prophylactic antibiotics.  Satisfactory interscalene block by anesthesia.  He was first placed in beach chair position on the sliding frame, I prepped the right shoulder girdle with DuraPrep, draped in sterile field, made an incision lying along the line of the clavicle and with subperiosteal dissection, identified the [facility] joint.  There was a soft mushy type of material but no discrete cyst noted at the [facility] joint. I measured a cm and a half from the [facility] joint and the subperiosteal dissection exposed the clavicle at this point.  I then placed 2 baby Bennett retractors beneath the clavicle and with a micro saw, amputated the clavicle and removed the  fragment of bone.  I removed few small spicules of bone from the parent clavicle and then covered this area with bone wax.  I then irrigated the wound well and placed Gelfoam in the resection space and then closed this wound with interrupted #1 Vicryl in the periosteal fascial complex, 2-0 Vicryl subcutaneous tissue, and staples in the skin.  Betadine, Adaptic, dry sterile dressings were applied.  We then kept the scrub nurse sterile while we removed the drapes and placed in a flat position and brought in arm board.  I applied pneumatic tourniquet and Esmarched out the right upper extremity with an Esmarch and inflated the tourniquet to 250 mmHg.  We then prepped the right hand and wrist and forearm with DuraPrep and draped in sterile field.  I made a transverse incision over the palpable mass and carefully carried down through the subcutaneous tissue.  The mass was identified and was clearly cystic.  I excised it from the surrounding tissues what turned out to be the extensor tendon sheaths. It did not arise from the wrist joint.  The cystic mass measured about a cm and a half in length and I sent to Pathology.  I then irrigated the wound well with sterile saline and closed the extensor tendon sheath  and retinaculum with interrupted 4-0 Vicryl.  The subcutaneous tissue was closed with the same with a running 4-0 nylon in the skin.  Betadine, Adaptic, dry sterile dressing, and a volar plaster splint were applied. Tourniquet was released.  He tolerated the procedure well and was taken to recovery room in satisfactory condition with no known complication. A time-out was performed for both procedures prior to the surgery.          ______________________________ Marlowe Kays, M.D.     JA/MEDQ  D:  04/03/2012  T:  04/04/2012  Job:  [id number]

## 2012-04-07 ENCOUNTER — Encounter (HOSPITAL_BASED_OUTPATIENT_CLINIC_OR_DEPARTMENT_OTHER): Payer: Self-pay | Admitting: Orthopedic Surgery

## 2012-04-09 ENCOUNTER — Encounter (HOSPITAL_BASED_OUTPATIENT_CLINIC_OR_DEPARTMENT_OTHER): Payer: Self-pay

## 2015-09-30 ENCOUNTER — Ambulatory Visit: Payer: 59

## 2015-10-02 ENCOUNTER — Emergency Department (HOSPITAL_COMMUNITY)
Admission: EM | Admit: 2015-10-02 | Discharge: 2015-10-02 | Discharge status: Against Medical Advice | Payer: No Typology Code available for payment source

## 2015-10-02 NOTE — ED Notes (Signed)
Pt left. 

## 2016-10-18 ENCOUNTER — Encounter (HOSPITAL_COMMUNITY): Payer: Self-pay | Admitting: Emergency Medicine

## 2016-10-18 ENCOUNTER — Emergency Department (HOSPITAL_COMMUNITY): Payer: Self-pay

## 2016-10-18 ENCOUNTER — Emergency Department (HOSPITAL_COMMUNITY)
Admission: EM | Admit: 2016-10-18 | Discharge: 2016-10-18 | Disposition: A | Discharge status: Home / Self Care | Payer: Self-pay | Attending: Emergency Medicine | Admitting: Emergency Medicine

## 2016-10-18 DIAGNOSIS — F17210 Nicotine dependence, cigarettes, uncomplicated: Secondary | ICD-10-CM | POA: Insufficient documentation

## 2016-10-18 DIAGNOSIS — R51 Headache: Secondary | ICD-10-CM | POA: Insufficient documentation

## 2016-10-18 DIAGNOSIS — R05 Cough: Secondary | ICD-10-CM | POA: Insufficient documentation

## 2016-10-18 DIAGNOSIS — R0602 Shortness of breath: Secondary | ICD-10-CM | POA: Insufficient documentation

## 2016-10-18 DIAGNOSIS — R059 Cough, unspecified: Secondary | ICD-10-CM

## 2016-10-18 DIAGNOSIS — J45909 Unspecified asthma, uncomplicated: Secondary | ICD-10-CM | POA: Insufficient documentation

## 2016-10-18 HISTORY — DX: Unspecified asthma, uncomplicated: J45.909

## 2016-10-18 LAB — COMPREHENSIVE METABOLIC PANEL
ALT: 24 U/L (ref 17–63)
ANION GAP: 5 (ref 5–15)
AST: 22 U/L (ref 15–41)
Albumin: 4.2 g/dL (ref 3.5–5.0)
Alkaline Phosphatase: 55 U/L (ref 38–126)
BILIRUBIN TOTAL: 0.8 mg/dL (ref 0.3–1.2)
BUN: 12 mg/dL (ref 6–20)
CO2: 28 mmol/L (ref 22–32)
CREATININE: 1.32 mg/dL — AB (ref 0.61–1.24)
Calcium: 9.2 mg/dL (ref 8.9–10.3)
Chloride: 107 mmol/L (ref 101–111)
GFR calc Af Amer: >60 mL/min (ref >60)
Glucose, Bld: 108 mg/dL — ABNORMAL HIGH (ref 65–99)
Potassium: 3.9 mmol/L (ref 3.5–5.1)
SODIUM: 140 mmol/L (ref 135–145)
TOTAL PROTEIN: 7.2 g/dL (ref 6.5–8.1)

## 2016-10-18 LAB — CBC
HEMATOCRIT: 40.7 % (ref 39.0–52.0)
HEMOGLOBIN: 14.2 g/dL (ref 13.0–17.0)
MCH: 30.1 pg (ref 26.0–34.0)
MCHC: 34.9 g/dL (ref 30.0–36.0)
MCV: 86.4 fL (ref 78.0–100.0)
Platelets: 300 10*3/uL (ref 150–400)
RBC: 4.71 MIL/uL (ref 4.22–5.81)
RDW: 12.8 % (ref 11.5–15.5)
WBC: 4.3 10*3/uL (ref 4.0–10.5)

## 2016-10-18 LAB — I-STAT TROPONIN, ED: TROPONIN I, POC: 0 ng/mL (ref 0.00–0.08)

## 2016-10-18 MED ORDER — ALBUTEROL SULFATE HFA 108 (90 BASE) MCG/ACT IN AERS
2.0000 | INHALATION_SPRAY | RESPIRATORY_TRACT | Status: DC
  Administered 2016-10-18: 2 via RESPIRATORY_TRACT
  Filled 2016-10-18: qty 6.7

## 2016-10-18 MED ORDER — ACETAMINOPHEN 500 MG PO TABS
1000.0000 mg | ORAL_TABLET | Freq: Once | ORAL | Status: AC
  Administered 2016-10-18: 1000 mg via ORAL
  Filled 2016-10-18: qty 2

## 2016-10-18 MED ORDER — PREDNISONE 20 MG PO TABS
60.0000 mg | ORAL_TABLET | Freq: Once | ORAL | Status: AC
  Administered 2016-10-18: 60 mg via ORAL
  Filled 2016-10-18: qty 3

## 2016-10-18 MED ORDER — ALBUTEROL SULFATE (2.5 MG/3ML) 0.083% IN NEBU
5.0000 mg | INHALATION_SOLUTION | Freq: Once | RESPIRATORY_TRACT | Status: AC
  Administered 2016-10-18: 5 mg via RESPIRATORY_TRACT
  Filled 2016-10-18: qty 6

## 2016-10-18 MED ORDER — KETOROLAC TROMETHAMINE 60 MG/2ML IM SOLN: 30.0000 mg | Freq: Once | INTRAMUSCULAR | Status: DC

## 2016-10-18 MED ORDER — PREDNISONE 10 MG PO TABS
40.0000 mg | ORAL_TABLET | Freq: Every day | ORAL | 0 refills | Status: AC
Start: 2016-10-18 — End: 2016-10-23

## 2016-10-18 MED ORDER — IPRATROPIUM-ALBUTEROL 0.5-2.5 (3) MG/3ML IN SOLN
3.0000 mL | Freq: Once | RESPIRATORY_TRACT | Status: AC
  Administered 2016-10-18: 3 mL via RESPIRATORY_TRACT
  Filled 2016-10-18: qty 3

## 2016-10-18 NOTE — ED Triage Notes (Signed)
Patient states that for over a week he had cough that is non productive, SOB esp with bending over, and headache. Patient has asthma and states that he doesn't have inhaler.

## 2016-10-18 NOTE — Discharge Instructions (Signed)
Please use prednisone 40mg  everyday in the morning with meals for 5 days. Please follow up with a Primary care provider regarding today's visit.   Get help right away if: You cough up blood. You have difficulty breathing. Your heartbeat is very fast.

## 2016-10-18 NOTE — ED Provider Notes (Signed)
[facility]-EMERGENCY DEPT Provider Note   CSN: [id number] Arrival date & time: 10/18/16  1406     History   Chief Complaint Chief Complaint  Patient presents with  . Cough  . Headache  . Shortness of Breath    HPI Christopher Hebert is a 51 y.o. male with PMHx of asthma presents today with complaints of nonproductive cough for 10 days. He reports it has been worsening. He reports associated HA, nasal congestion. He reports his HA starts at occipital and covers his head to his forehead, worsening, constant 8/10.  He reports trying mucinex, tylenol cold and sinus with mild relief. He reports associated chest heaviness and shortness of breath with activity, "even tying my shoe". He reports his chest pain is pressure, "across the chest", sometimes hurts his back, pleuritic, worsening, intermittent 7/10. He states moving, walking, or moving his arms makes his chest pain worse. He reports profuse night sweats. He denies having anything like this before. He states it is not like his asthma. He sates he is concerned he may have pneumonia.  He also reports chills. He denies fevers, visual changes, n/v/d. He denies hx of HTN, HLD, sedentary lifestyle, DM, obesity. He denies family hx of heart disease but does admit to his uncle passing 2 years ago from heart attack. He reports smoking cigars once to twice a day. He denies hormone therapy, unilateral leg swelling, hemoptysis. 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 He denies hx of DVT or PE. He denies hx of COPD.   The history is provided by the patient. No language interpreter was used.  Cough  Associated symptoms include chills, headaches, shortness of breath and wheezing. Pertinent negatives include no chest pain.  Headache   Associated symptoms include shortness of breath. Pertinent negatives include no fever, no nausea and no vomiting.  Shortness of Breath  Associated symptoms include headaches, cough and  wheezing. Pertinent negatives include no fever, no chest pain, no vomiting and no abdominal pain.    Past Medical History:  Diagnosis Date  . Asthma   . Headache(784.0)   . Sleep apnea    no appliance    There are no active problems to display for this patient.   Past Surgical History:  Procedure Laterality Date  . GANGLION CYST EXCISION  04/03/2012   Procedure: REMOVAL GANGLION OF WRIST;  Surgeon: Drucilla Schmidt, MD;  Location: [facility];  Service: Orthopedics;  Laterality: Right;  . SHOULDER ARTHROSCOPY  3/12,5/12   rt rcr  . SHOULDER OPEN ROTATOR CUFF REPAIR  04/03/2012   Procedure: ROTATOR CUFF REPAIR SHOULDER OPEN;  Surgeon: Drucilla Schmidt, MD;  Location: [facility];  Service: Orthopedics;  Laterality: Right;  RIGHT SHOULDER OPEN DISTAL CLAVICLE RESECTION AND RIGHT WRIST EXCISION OF GANGLION CYST       Home Medications    Prior to Admission medications   Medication Sig Start Date End Date Taking? Authorizing Provider  pseudoephedrine-guaifenesin (MUCINEX D) 60-600 MG 12 hr tablet Take 1 tablet by mouth every 12 (twelve) hours.   Yes [provider]  cephALEXin (KEFLEX) 500 MG capsule Take 1 capsule (500 mg total) by mouth 4 (four) times daily. Patient not taking: Reported on 10/18/2016 04/03/12   Aplington, Illene Labrador, MD  ketorolac (TORADOL) 10 MG tablet Take 1 tablet (10 mg total) by mouth every 6 (six) hours as needed for pain. Patient not taking: Reported on 10/18/2016  04/03/12   Aplington, Illene Labrador, MD  oxyCODONE-acetaminophen (PERCOCET) 7.5-325 MG per tablet Take 1 tablet by mouth every 4 (four) hours as needed for pain. Patient not taking: Reported on 10/18/2016 04/03/12   Aplington, Illene Labrador, MD  predniSONE (DELTASONE) 10 MG tablet Take 4 tablets (40 mg total) by mouth daily. 10/18/16 10/23/16  Alvina Chou, PA    Family History No family history on file.  Social History Social History  Substance Use Topics  .  Smoking status: Current Some Day Smoker    Types: Cigars  . Smokeless tobacco: Never Used     Comment: 6 cigars/wk  . Alcohol use No     Allergies   Patient has no known allergies.   Review of Systems Review of Systems  Constitutional: Positive for chills and diaphoresis. Negative for fever.  HENT: Positive for congestion.   Eyes: Negative for photophobia and visual disturbance.  Respiratory: Positive for cough, shortness of breath and wheezing.   Cardiovascular: Negative for chest pain.  Gastrointestinal: Negative for abdominal pain, diarrhea, nausea and vomiting.  Genitourinary: Negative for difficulty urinating and dysuria.  Musculoskeletal: Positive for back pain (sometimes).  Neurological: Positive for headaches. Negative for syncope and numbness.  All other systems reviewed and are negative.    Physical Exam Updated Vital Signs BP 129/89   Pulse 85   Temp 98 F (36.7 C) (Oral)   Resp 16   Ht 5\' 6"  (1.676 m)   Wt 90.7 kg (200 lb)   SpO2 100%   BMI 32.28 kg/m   Physical Exam  Constitutional: He is oriented to person, place, and time. He appears well-developed and well-nourished. No distress.  Well appearing  HENT:  Head: Normocephalic and atraumatic.  Right Ear: External ear normal.  Left Ear: External ear normal.  Nose: Nose normal.  Mouth/Throat: Oropharynx is clear and moist. No oropharyngeal exudate.  Eyes: Conjunctivae and EOM are normal. Pupils are equal, round, and reactive to light.  Neck: Normal range of motion. No JVD present. No tracheal deviation present.  Cardiovascular: Normal rate, normal heart sounds and intact distal pulses.   No murmur heard. Pulmonary/Chest: Effort normal. No stridor. No respiratory distress. He has wheezes. He has no rales. He exhibits tenderness.  Normal work of breathing. No respiratory distress noted.   Abdominal: Soft. Bowel sounds are normal. There is no tenderness. There is no rebound and no guarding.  Soft and  nontender. Negative murphy's sign. No focal tenderness and Mcburney's point. No CVA tenderness.   Musculoskeletal: Normal range of motion. He exhibits tenderness.  Reproducible chest pain.   Lymphadenopathy:    He has no cervical adenopathy.  Neurological: He is alert and oriented to person, place, and time. He has normal strength. No cranial nerve deficit or sensory deficit. Coordination and gait normal. GCS eye subscore is 4. GCS verbal subscore is 5. GCS motor subscore is 6.  Skin: Skin is warm. Capillary refill takes less than 2 seconds.  No leg swelling, redness, or TTP.   Psychiatric: He has a normal mood and affect. His behavior is normal.  Nursing note and vitals reviewed.    ED Treatments / Results  Labs (all labs ordered are listed, but only abnormal results are displayed) Labs Reviewed  COMPREHENSIVE METABOLIC PANEL - Abnormal; Notable for the following:       Result Value   Glucose, Bld 108 (*)    Creatinine, Ser 1.32 (*)    All other components within normal limits  CBC  Rosezena SensorI-STAT TROPOININ, ED    EKG  EKG Interpretation  Date/Time:  Friday Oct 18 2016 14:18:32 EDT Ventricular Rate:  78 PR Interval:    QRS Duration: 95 QT Interval:  379 QTC Calculation: 432 R Axis:   41 Text Interpretation:  Sinus rhythm Normal ECG Confirmed by Gwyneth SproutPlunkett, Whitney ([id number]) on 10/18/2016 3:19:55 PM       Radiology Dg Chest 2 View  Result Date: 10/18/2016 CLINICAL DATA:  Worsening shortness of breath, cough, and headache for or 1.5 weeks. EXAM: CHEST  2 VIEW COMPARISON:  None. FINDINGS: The cardiomediastinal silhouette is within normal limits. The lungs are well inflated and clear. There is no evidence of pleural effusion or pneumothorax. No acute osseous abnormality is identified. IMPRESSION: No active cardiopulmonary disease. Electronically Signed   By: Sebastian AcheAllen  Grady M.D.   On: 10/18/2016 15:05    Procedures Procedures (including critical care time)  Medications Ordered in  ED Medications  albuterol (PROVENTIL HFA;VENTOLIN HFA) 108 (90 Base) MCG/ACT inhaler 2 puff (2 puffs Inhalation Rx Charged 10/18/16 1700)  albuterol (PROVENTIL) (2.5 MG/3ML) 0.083% nebulizer solution 5 mg (5 mg Nebulization Given 10/18/16 1459)  ipratropium-albuterol (DUONEB) 0.5-2.5 (3) MG/3ML nebulizer solution 3 mL (3 mLs Nebulization Given 10/18/16 1634)  predniSONE (DELTASONE) tablet 60 mg (60 mg Oral Given 10/18/16 1634)  acetaminophen (TYLENOL) tablet 1,000 mg (1,000 mg Oral Given 10/18/16 1634)     Initial Impression / Assessment and Plan / ED Course  I have reviewed the triage vital signs and the nursing notes.  Pertinent labs & imaging results that were available during my care of the patient were reviewed by me and considered in my medical decision making (see chart for details).    Pt symptoms consistent with likely URI. CXR negative for acute infiltrate. Pt will be discharged with symptomatic treatment.  Discussed return precautions.  Pt is hemodynamically stable & in NAD prior to discharge. Pt given breathing treatments, steroids, and tylenol with moderate relief.   Chest pain is not likely of cardiac or pulmonary etiology d/t presentation, VSS, no tracheal deviation, no JVD or new murmur, RRR, breath sounds equal bilaterally, neurologically intact, EKG without acute abnormalities, negative troponin, and negative CXR. Heart score 3. Wells PE score 0. Pt has been advised start a PPI and return to the ED is CP becomes exertional, associated with diaphoresis or nausea, radiates to left jaw/arm, worsens or becomes concerning in any way. Pt appears reliable for follow up and is agreeable to discharge.   Patient is to be discharged with recommendation to follow up with PCP in regards to today's hospital visit. Pt verbally understands and is in agreement with assessment and plan.   Case has been discussed with Dr. Tanna SavoyPlunket who agrees with the above plan to discharge.    Final Clinical  Impressions(s) / ED Diagnoses   Final diagnoses:  Cough    New Prescriptions New Prescriptions   PREDNISONE (DELTASONE) 10 MG TABLET    Take 4 tablets (40 mg total) by mouth daily.     Candie Milespina, Jenyfer Trawick MadisonManuel, GeorgiaPA 10/18/16 1723    Gwyneth SproutPlunkett, Whitney, MD 10/18/16 [phone]

## 2016-12-25 ENCOUNTER — Encounter: Payer: Self-pay | Admitting: Physician Assistant

## 2016-12-25 ENCOUNTER — Ambulatory Visit (INDEPENDENT_AMBULATORY_CARE_PROVIDER_SITE_OTHER): Payer: Self-pay | Admitting: Physician Assistant

## 2016-12-25 VITALS — BP 137/81 | HR 97 | Temp 98.0°F | Resp 18 | Ht 68.5 in | Wt 198.0 lb

## 2016-12-25 DIAGNOSIS — Z0289 Encounter for other administrative examinations: Secondary | ICD-10-CM

## 2016-12-25 NOTE — Patient Instructions (Signed)
     IF you received an x-ray today, you will receive an invoice from [facility]. Please contact [facility] at [phone] with questions or concerns regarding your invoice.   IF you received labwork today, you will receive an invoice from [facility]. Please contact [facility] at [phone] with questions or concerns regarding your invoice.   Our billing staff will not be able to assist you with questions regarding bills from these companies.  You will be contacted with the lab results as soon as they are available. The fastest way to get your results is to activate your My Chart account. Instructions are located on the last page of this paperwork. If you have not heard from us regarding the results in 2 weeks, please contact this office.     

## 2016-12-25 NOTE — Progress Notes (Signed)
[facility] [address], [place] [phone]  Date:  12/25/2016   Name:  Christopher Hebert   DOB:  06-05-1965   MRN:  [id number]  PCP:  Patient, No Pcp Per    History of Present Illness:  Christopher Hebert is a 51 y.o. male patient who presents to PCP with  Chief Complaint  Patient presents with  . Annual Exam    DOT     Patient has no concerns or complaints at this time.   There are no active problems to display for this patient.   Past Medical History:  Diagnosis Date  . Asthma   . Headache(784.0)   . Sleep apnea    no appliance    Past Surgical History:  Procedure Laterality Date  . GANGLION CYST EXCISION  04/03/2012   Procedure: REMOVAL GANGLION OF WRIST;  Surgeon: Drucilla SchmidtJames P Aplington, MD;  Location: [facility];  Service: Orthopedics;  Laterality: Right;  . SHOULDER ARTHROSCOPY  3/12,5/12   rt rcr  . SHOULDER OPEN ROTATOR CUFF REPAIR  04/03/2012   Procedure: ROTATOR CUFF REPAIR SHOULDER OPEN;  Surgeon: Drucilla SchmidtJames P Aplington, MD;  Location:  SURGERY CENTER;  Service: Orthopedics;  Laterality: Right;  RIGHT SHOULDER OPEN DISTAL CLAVICLE RESECTION AND RIGHT WRIST EXCISION OF GANGLION CYST    Social History  Substance Use Topics  . Smoking status: Current Some Day Smoker    Types: Cigars  . Smokeless tobacco: Never Used     Comment: 6 cigars/wk  . Alcohol use No    History reviewed. No pertinent family history.  No Known Allergies  Medication list has been reviewed and updated.  No current outpatient prescriptions on file prior to visit.   No current facility-administered medications on file prior to visit.     ROS ROS otherwise unremarkable unless listed above.  Physical Examination: BP 137/81   Pulse 97   Temp 98 F (36.7 C) (Oral)   Resp 18   Ht 5' 8.5" (1.74 m)   Wt 198 lb (89.8 kg)   SpO2 98%   BMI 29.66 kg/m  Ideal Body Weight: Weight in (lb) to have BMI = 25: 166.5  Physical Exam  Constitutional:  He is oriented to person, place, and time. He appears well-developed and well-nourished. No distress.  HENT:  Head: Normocephalic and atraumatic.  Right Ear: Tympanic membrane, external ear and ear canal normal.  Left Ear: Tympanic membrane, external ear and ear canal normal.  Eyes: Pupils are equal, round, and reactive to light. Conjunctivae and EOM are normal.  Cardiovascular: Normal rate and regular rhythm.  Exam reveals no friction rub.   No murmur heard. Pulmonary/Chest: Effort normal. No respiratory distress. He has no wheezes.  Abdominal: Soft. Bowel sounds are normal. He exhibits no distension and no mass. There is no tenderness. Hernia confirmed negative in the right inguinal area and confirmed negative in the left inguinal area.  Musculoskeletal: Normal range of motion. He exhibits no edema or tenderness.  Neurological: He is alert and oriented to person, place, and time. He displays normal reflexes.  Skin: Skin is warm and dry. He is not diaphoretic.  Psychiatric: He has a normal mood and affect. His behavior is normal.    Visual Acuity Screening   Right eye Left eye Both eyes  Without correction: 20/20 20/20 20/20   With correction:     Hearing Screening Comments: Left: 6 ft Right: 956ft    Assessment and Plan: Christopher Hebert is a  51 y.o. male who is here today for cc of dot physical.   1. Encounter for examination required by Department of Transportation (DOT)   Trena PlattStephanie Avari Nevares, PA-C [facility] 12/25/2016 3:04 PM

## 2017-08-25 ENCOUNTER — Encounter: Payer: Self-pay | Admitting: Physician Assistant

## 2017-09-30 ENCOUNTER — Emergency Department (HOSPITAL_COMMUNITY)
Admission: EM | Admit: 2017-09-30 | Discharge: 2017-09-30 | Discharge status: Against Medical Advice | Payer: Self-pay | Attending: Emergency Medicine | Admitting: Emergency Medicine

## 2017-09-30 ENCOUNTER — Encounter (HOSPITAL_COMMUNITY): Payer: Self-pay | Admitting: Emergency Medicine

## 2017-09-30 DIAGNOSIS — Z5321 Procedure and treatment not carried out due to patient leaving prior to being seen by health care provider: Secondary | ICD-10-CM | POA: Insufficient documentation

## 2017-09-30 MED ORDER — ALBUTEROL SULFATE (2.5 MG/3ML) 0.083% IN NEBU
5.0000 mg | INHALATION_SOLUTION | Freq: Once | RESPIRATORY_TRACT | Status: AC
  Administered 2017-09-30: 5 mg via RESPIRATORY_TRACT
  Filled 2017-09-30: qty 6

## 2017-09-30 NOTE — ED Notes (Signed)
No answer when called for vital signs 

## 2017-09-30 NOTE — ED Triage Notes (Signed)
Pt reports that he doesn't have PCP and out of his inhaler. Reports asthma been flaring up over the past 2-3 weeks. Has dry cough.

## 2017-10-01 ENCOUNTER — Encounter (HOSPITAL_COMMUNITY): Payer: Self-pay | Admitting: Emergency Medicine

## 2017-10-01 ENCOUNTER — Emergency Department (HOSPITAL_COMMUNITY): Payer: Self-pay

## 2017-10-01 ENCOUNTER — Emergency Department (HOSPITAL_COMMUNITY)
Admission: EM | Admit: 2017-10-01 | Discharge: 2017-10-01 | Disposition: A | Discharge status: Home / Self Care | Payer: Self-pay | Attending: Emergency Medicine | Admitting: Emergency Medicine

## 2017-10-01 ENCOUNTER — Other Ambulatory Visit: Payer: Self-pay

## 2017-10-01 DIAGNOSIS — J029 Acute pharyngitis, unspecified: Secondary | ICD-10-CM | POA: Insufficient documentation

## 2017-10-01 DIAGNOSIS — J028 Acute pharyngitis due to other specified organisms: Secondary | ICD-10-CM

## 2017-10-01 DIAGNOSIS — J4521 Mild intermittent asthma with (acute) exacerbation: Secondary | ICD-10-CM | POA: Insufficient documentation

## 2017-10-01 DIAGNOSIS — Z720 Tobacco use: Secondary | ICD-10-CM | POA: Insufficient documentation

## 2017-10-01 DIAGNOSIS — R05 Cough: Secondary | ICD-10-CM | POA: Insufficient documentation

## 2017-10-01 DIAGNOSIS — B9789 Other viral agents as the cause of diseases classified elsewhere: Secondary | ICD-10-CM | POA: Insufficient documentation

## 2017-10-01 LAB — CBC WITH DIFFERENTIAL/PLATELET
Basophils Absolute: 0 10*3/uL (ref 0.0–0.1)
Basophils Relative: 1 %
EOS ABS: 0.5 10*3/uL (ref 0.0–0.7)
Eosinophils Relative: 9 %
HEMATOCRIT: 41.6 % (ref 39.0–52.0)
HEMOGLOBIN: 14.4 g/dL (ref 13.0–17.0)
Lymphocytes Relative: 30 %
Lymphs Abs: 1.5 10*3/uL (ref 0.7–4.0)
MCH: 30.7 pg (ref 26.0–34.0)
MCHC: 34.6 g/dL (ref 30.0–36.0)
MCV: 88.7 fL (ref 78.0–100.0)
MONOS PCT: 12 %
Monocytes Absolute: 0.6 10*3/uL (ref 0.1–1.0)
NEUTROS PCT: 48 %
Neutro Abs: 2.4 10*3/uL (ref 1.7–7.7)
Platelets: 271 10*3/uL (ref 150–400)
RBC: 4.69 MIL/uL (ref 4.22–5.81)
RDW: 13 % (ref 11.5–15.5)
WBC: 5.1 10*3/uL (ref 4.0–10.5)

## 2017-10-01 LAB — COMPREHENSIVE METABOLIC PANEL
ALK PHOS: 47 U/L (ref 38–126)
ALT: 32 U/L (ref 17–63)
AST: 27 U/L (ref 15–41)
Albumin: 4 g/dL (ref 3.5–5.0)
Anion gap: 9 (ref 5–15)
BUN: 14 mg/dL (ref 6–20)
CALCIUM: 9.2 mg/dL (ref 8.9–10.3)
CO2: 26 mmol/L (ref 22–32)
CREATININE: 1.38 mg/dL — AB (ref 0.61–1.24)
Chloride: 103 mmol/L (ref 101–111)
GFR, EST NON AFRICAN AMERICAN: 57 mL/min — AB (ref >60)
Glucose, Bld: 100 mg/dL — ABNORMAL HIGH (ref 65–99)
Potassium: 4.2 mmol/L (ref 3.5–5.1)
SODIUM: 138 mmol/L (ref 135–145)
Total Bilirubin: 0.4 mg/dL (ref 0.3–1.2)
Total Protein: 7 g/dL (ref 6.5–8.1)

## 2017-10-01 MED ORDER — PREDNISONE 20 MG PO TABS
60.0000 mg | ORAL_TABLET | Freq: Once | ORAL | Status: AC
  Administered 2017-10-01: 60 mg via ORAL
  Filled 2017-10-01: qty 3

## 2017-10-01 MED ORDER — PREDNISONE 10 MG PO TABS: 40.0000 mg | ORAL_TABLET | Freq: Every day | ORAL | 0 refills | Status: AC

## 2017-10-01 MED ORDER — ALBUTEROL SULFATE HFA 108 (90 BASE) MCG/ACT IN AERS
1.0000 | INHALATION_SPRAY | Freq: Once | RESPIRATORY_TRACT | Status: AC
  Administered 2017-10-01: 1 via RESPIRATORY_TRACT
  Filled 2017-10-01: qty 6.7

## 2017-10-01 MED ORDER — IPRATROPIUM-ALBUTEROL 0.5-2.5 (3) MG/3ML IN SOLN
3.0000 mL | Freq: Once | RESPIRATORY_TRACT | Status: AC
  Administered 2017-10-01: 3 mL via RESPIRATORY_TRACT
  Filled 2017-10-01: qty 3

## 2017-10-01 NOTE — ED Provider Notes (Signed)
[facility]-EMERGENCY DEPT Provider Note   CSN: [id number] Arrival date & time: 10/01/17  1047     History   Chief Complaint Chief Complaint  Patient presents with  . Cough    HPI Christopher Hebert is a 52 y.o. male with history of asthma presents for evaluation of gradual onset, progressively worsening shortness of breath and sore throat for 2 weeks.  He states that he believes that he is having an asthma exacerbation and notes shortness of breath and dyspnea on exertion.  He notes chest tightness but no true chest pain.  He does note an achy sensation to the chest all over which worsens with cough.  He has a nonproductive cough.  He has mild nasal congestion and sore throat but denies difficulty swallowing or drooling.  No fevers or chills.  He states he ran out of his inhaler several months ago.  He has been trying DayQuil without significant relief of his symptoms.  He currently smokes 2 cigars monthly but denies cigarette smoking or marijuana smoking.  The history is provided by the patient.    Past Medical History:  Diagnosis Date  . Asthma   . Headache(784.0)   . Sleep apnea    no appliance    There are no active problems to display for this patient.   Past Surgical History:  Procedure Laterality Date  . GANGLION CYST EXCISION  04/03/2012   Procedure: REMOVAL GANGLION OF WRIST;  Surgeon: Drucilla Schmidt, MD;  Location: [facility];  Service: Orthopedics;  Laterality: Right;  . SHOULDER ARTHROSCOPY  3/12,5/12   rt rcr  . SHOULDER OPEN ROTATOR CUFF REPAIR  04/03/2012   Procedure: ROTATOR CUFF REPAIR SHOULDER OPEN;  Surgeon: Drucilla Schmidt, MD;  Location: [facility];  Service: Orthopedics;  Laterality: Right;  RIGHT SHOULDER OPEN DISTAL CLAVICLE RESECTION AND RIGHT WRIST EXCISION OF GANGLION CYST        Home Medications    Prior to Admission medications   Medication Sig Start Date End Date Taking? Authorizing  Provider  predniSONE (DELTASONE) 10 MG tablet Take 4 tablets (40 mg total) by mouth daily with breakfast for 5 days. 10/01/17 10/06/17  Jeanie Sewer, PA-C    Family History No family history on file.  Social History Social History   Tobacco Use  . Smoking status: Current Some Day Smoker    Types: Cigars  . Smokeless tobacco: Never Used  Substance Use Topics  . Alcohol use: No  . Drug use: No     Allergies   Patient has no known allergies.   Review of Systems Review of Systems  Constitutional: Negative for chills and fever.  HENT: Positive for congestion and sore throat. Negative for drooling, facial swelling and trouble swallowing.   Respiratory: Positive for chest tightness, shortness of breath and wheezing.   Cardiovascular: Negative for chest pain.  Gastrointestinal: Negative for abdominal pain, nausea and vomiting.  All other systems reviewed and are negative.    Physical Exam Updated Vital Signs BP 139/85 (BP Location: Right Arm)   Pulse 100   Temp 100 F (37.8 C) (Oral)   Resp (!) 22   SpO2 98%   Physical Exam  Constitutional: He appears well-developed and well-nourished. No distress.  HENT:  Head: Normocephalic and atraumatic.  TMs unable to be visualized due to cerumen impaction.  Nasal septum midline with pale boggy mucosa bilaterally.  Posterior oropharynx with postnasal drip and mild erythema but no tonsillar hypertrophy,  exudates, uvular deviation, or trismus noted.  No sublingual of normalities.  Tolerating secretions without difficulty.  Eyes: Pupils are equal, round, and reactive to light. Conjunctivae and EOM are normal. Right eye exhibits no discharge. Left eye exhibits no discharge.  Neck: Normal range of motion. Neck supple. No JVD present. No tracheal deviation present.  Cardiovascular: Normal rate, regular rhythm, normal heart sounds and intact distal pulses.  Pulmonary/Chest: Effort normal. He has wheezes. He exhibits tenderness.  Diffuse  inspiratory and expiratory wheezing on auscultation of the lungs.  Equal rise and fall of chest, mildly tachypneic.  Speaking in full sentences without difficulty.  Diffuse tenderness to palpation of the chest wall anteriorly and posteriorly, worst laterally.  No crepitus, ecchymosis, or flail segment noted.  Abdominal: Soft. Bowel sounds are normal. He exhibits no distension. There is no tenderness.  Musculoskeletal: He exhibits no edema.  Neurological: He is alert.  Skin: Skin is warm and dry. No erythema.  Psychiatric: He has a normal mood and affect. His behavior is normal.  Nursing note and vitals reviewed.    ED Treatments / Results  Labs (all labs ordered are listed, but only abnormal results are displayed) Labs Reviewed  COMPREHENSIVE METABOLIC PANEL - Abnormal; Notable for the following components:      Result Value   Glucose, Bld 100 (*)    Creatinine, Ser 1.38 (*)    GFR calc non Af Amer 57 (*)    All other components within normal limits  CBC WITH DIFFERENTIAL/PLATELET    EKG None  Radiology No results found.  Procedures Procedures (including critical care time)  Medications Ordered in ED Medications  albuterol (PROVENTIL HFA;VENTOLIN HFA) 108 (90 Base) MCG/ACT inhaler 1 puff (has no administration in time range)  ipratropium-albuterol (DUONEB) 0.5-2.5 (3) MG/3ML nebulizer solution 3 mL (3 mLs Nebulization Given 10/01/17 1733)  predniSONE (DELTASONE) tablet 60 mg (60 mg Oral Given 10/01/17 1733)     Initial Impression / Assessment and Plan / ED Course  I have reviewed the triage vital signs and the nursing notes.  Pertinent labs & imaging results that were available during my care of the patient were reviewed by me and considered in my medical decision making (see chart for details).     Patient presents with complaint of intermittent asthma flare for 2 weeks with associated sore throat and mild nasal congestion.  Low-grade fever and mild tachypnea on initial  evaluation with resolution on reevaluation.  Lungs with diffuse inspiratory and expiratory wheezing on auscultation initially.  He declines chest x-ray at this time.  Lab work reviewed by me shows a mildly elevated creatinine but otherwise no leukocytosis, anemia, or significant electrolyte abnormalities.  On chart review, this creatinine appears to be at the patient's baseline.  Physical examination is consistent with viral URI versus viral pharyngitis causing asthma exacerbation.  Presentation is not concerning for meningitis, PTA, strep pharyngitis, or spread of infection to soft tissues.  Doubt PE as patient symptoms appear to be more infectious in etiology and he has no risk factors for PE.  He is tolerating secretions without difficulty and able to drink p.o. fluids in the ED without difficulty.  He was given a breathing treatment and prednisone in the ED and on reevaluation his wheezing has entirely resolved and he states he is feeling much better.  Will refill his albuterol inhaler and discharged with a 5-day prednisone burst.  Discussed symptom medic treatment of his nasal congestion and sore throat.  Recommend follow-up with a  primary care physician.  He is obtaining new health insurance from his employer and will call the back of the number on the insurance card to help set up follow-up with a primary care doctor.  Encourage the patient to quit smoking cigars.  Discussed strict ED return precautions. Pt verbalized understanding of and agreement with plan and is safe for discharge home at this time.  Final Clinical Impressions(s) / ED Diagnoses   Final diagnoses:  Mild intermittent asthma with exacerbation  Acute viral pharyngitis    ED Discharge Orders        Ordered    predniSONE (DELTASONE) 10 MG tablet  Daily with breakfast     10/01/17 1809       Jeanie Sewer, PA-C 10/01/17 2241    Charlynne Pander, MD 10/03/17 [phone]

## 2017-10-01 NOTE — ED Triage Notes (Signed)
Pt complaint of cough; onset for 2 weeks; hx of asthma; "out of breath when getting around."

## 2017-10-01 NOTE — Discharge Instructions (Addendum)
Start taking prednisone as prescribed beginning tomorrow.  You received the first dose in the emergency department today.  You can use your inhaler every 4-6 hours as needed for shortness of breath.    Drink plenty of water and get plenty of rest.  Gargle warm salt water and spit it out for sore throat. May also use cough drops, warm teas, Chloraseptic spray over-the-counter etc. Take flonase to decrease nasal congestion.  Over-the-counter allergy medicines or cold medicines for nasal congestion and scratchy throat. Take [phone] mg of Tylenol every 6 hours as needed for pain/fever. Do not exceed 4000 mg of Tylenol daily.     Followup with your primary care doctor in 5-7 days for recheck of ongoing symptoms.  Call the number on the back of your insurance card or go on the insurance website to help set up an appointment.  Return to emergency department for emergent changing or worsening of symptoms such as throat tightness, facial swelling, fever not controlled by ibuprofen or Tylenol,difficulty breathing, or chest pain.

## 2017-10-01 NOTE — ED Notes (Signed)
Pt reports that he has been SOB while walking around. Pt declined a chest xray . Writer provided explanation as to why X ray was necessary. Pt reports that he has sore throat  And chest congestion. Pt reports that symptoms started 2 weeks ago. Pt reports that he does not have rx  for inhaler

## 2018-03-28 IMAGING — CR DG CHEST 2V
2 series · 2 of 2 positions shown · non-contrast
Comparison: None.

CLINICAL DATA: Worsening shortness of breath, cough, and headache
for or 1.5 weeks.

EXAM:
CHEST  2 VIEW

[w chest pa]
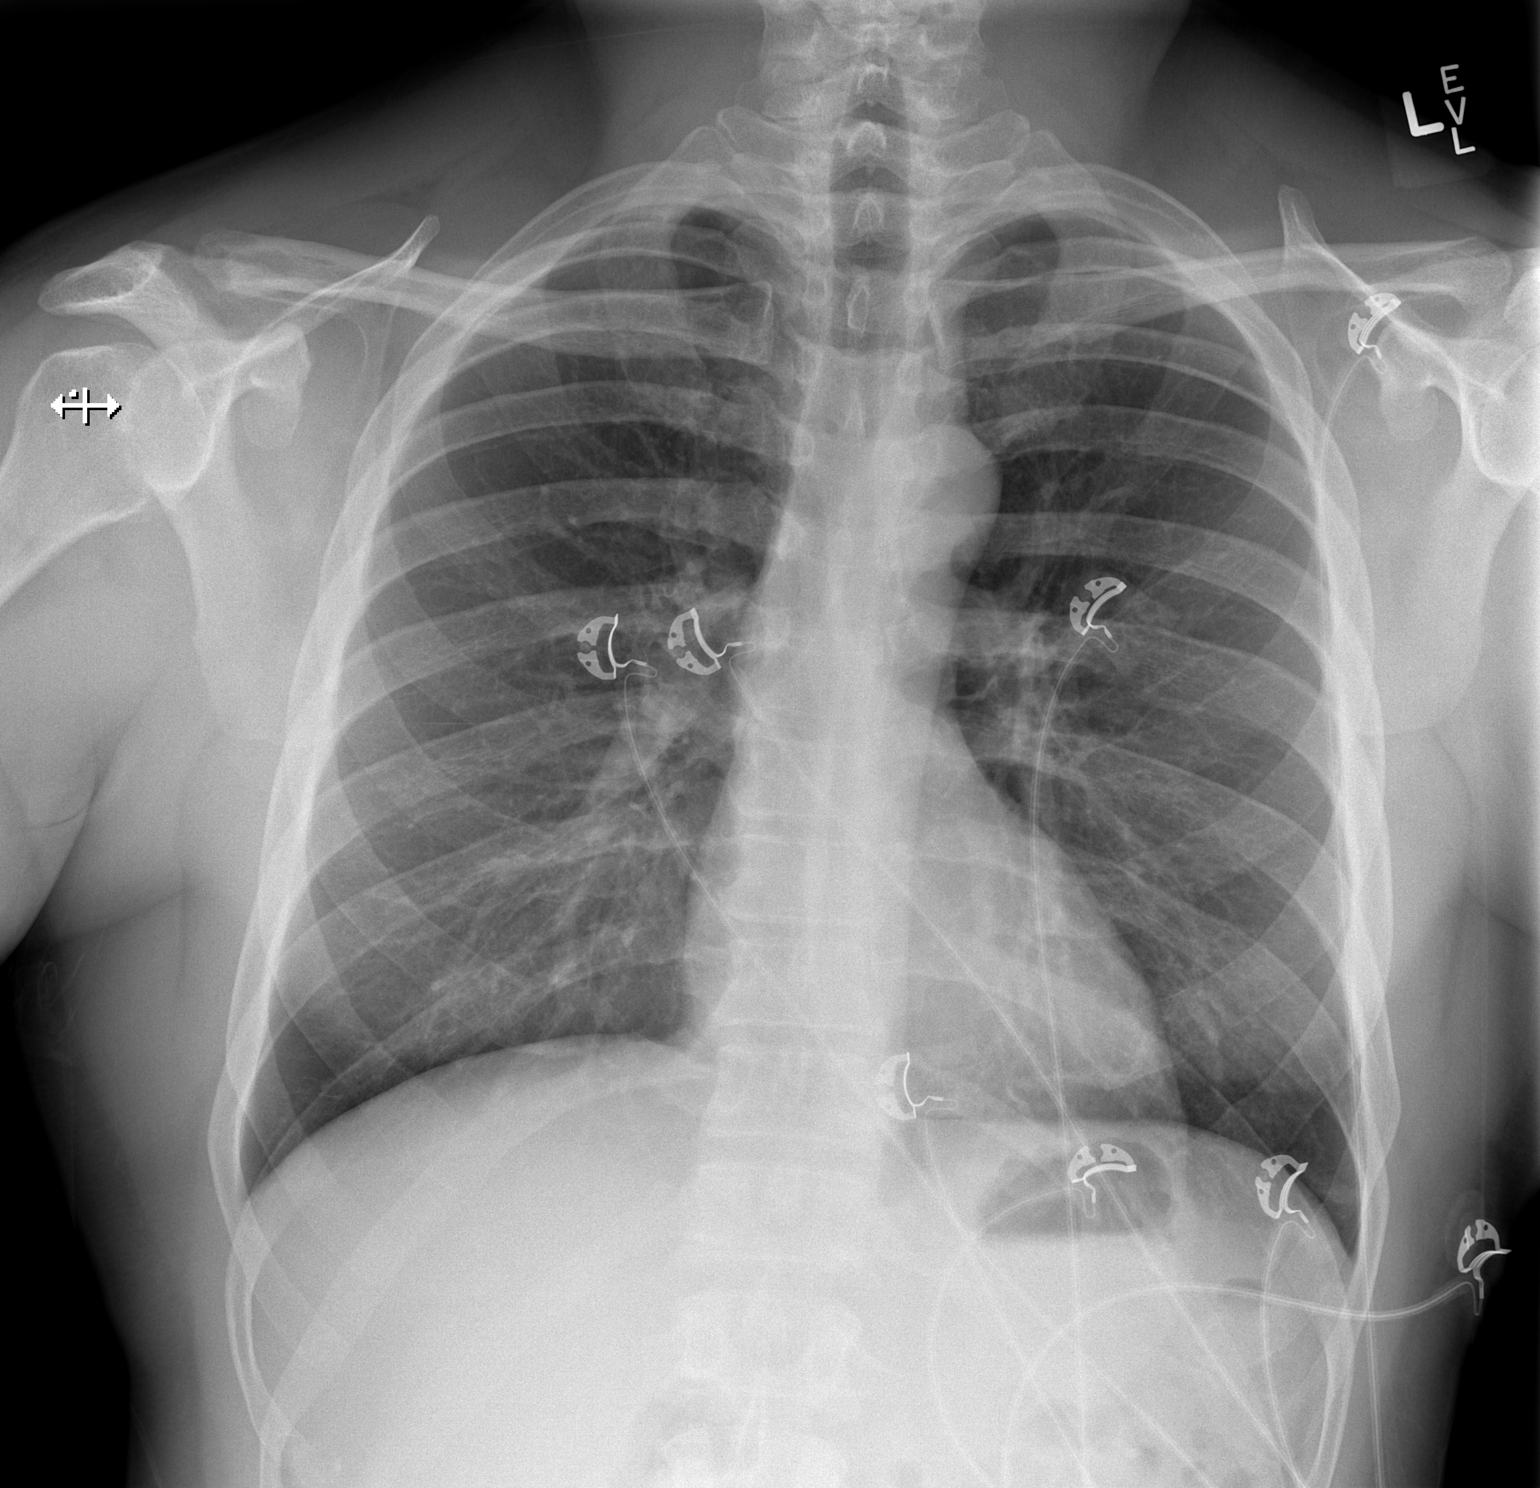

[w chest lat]
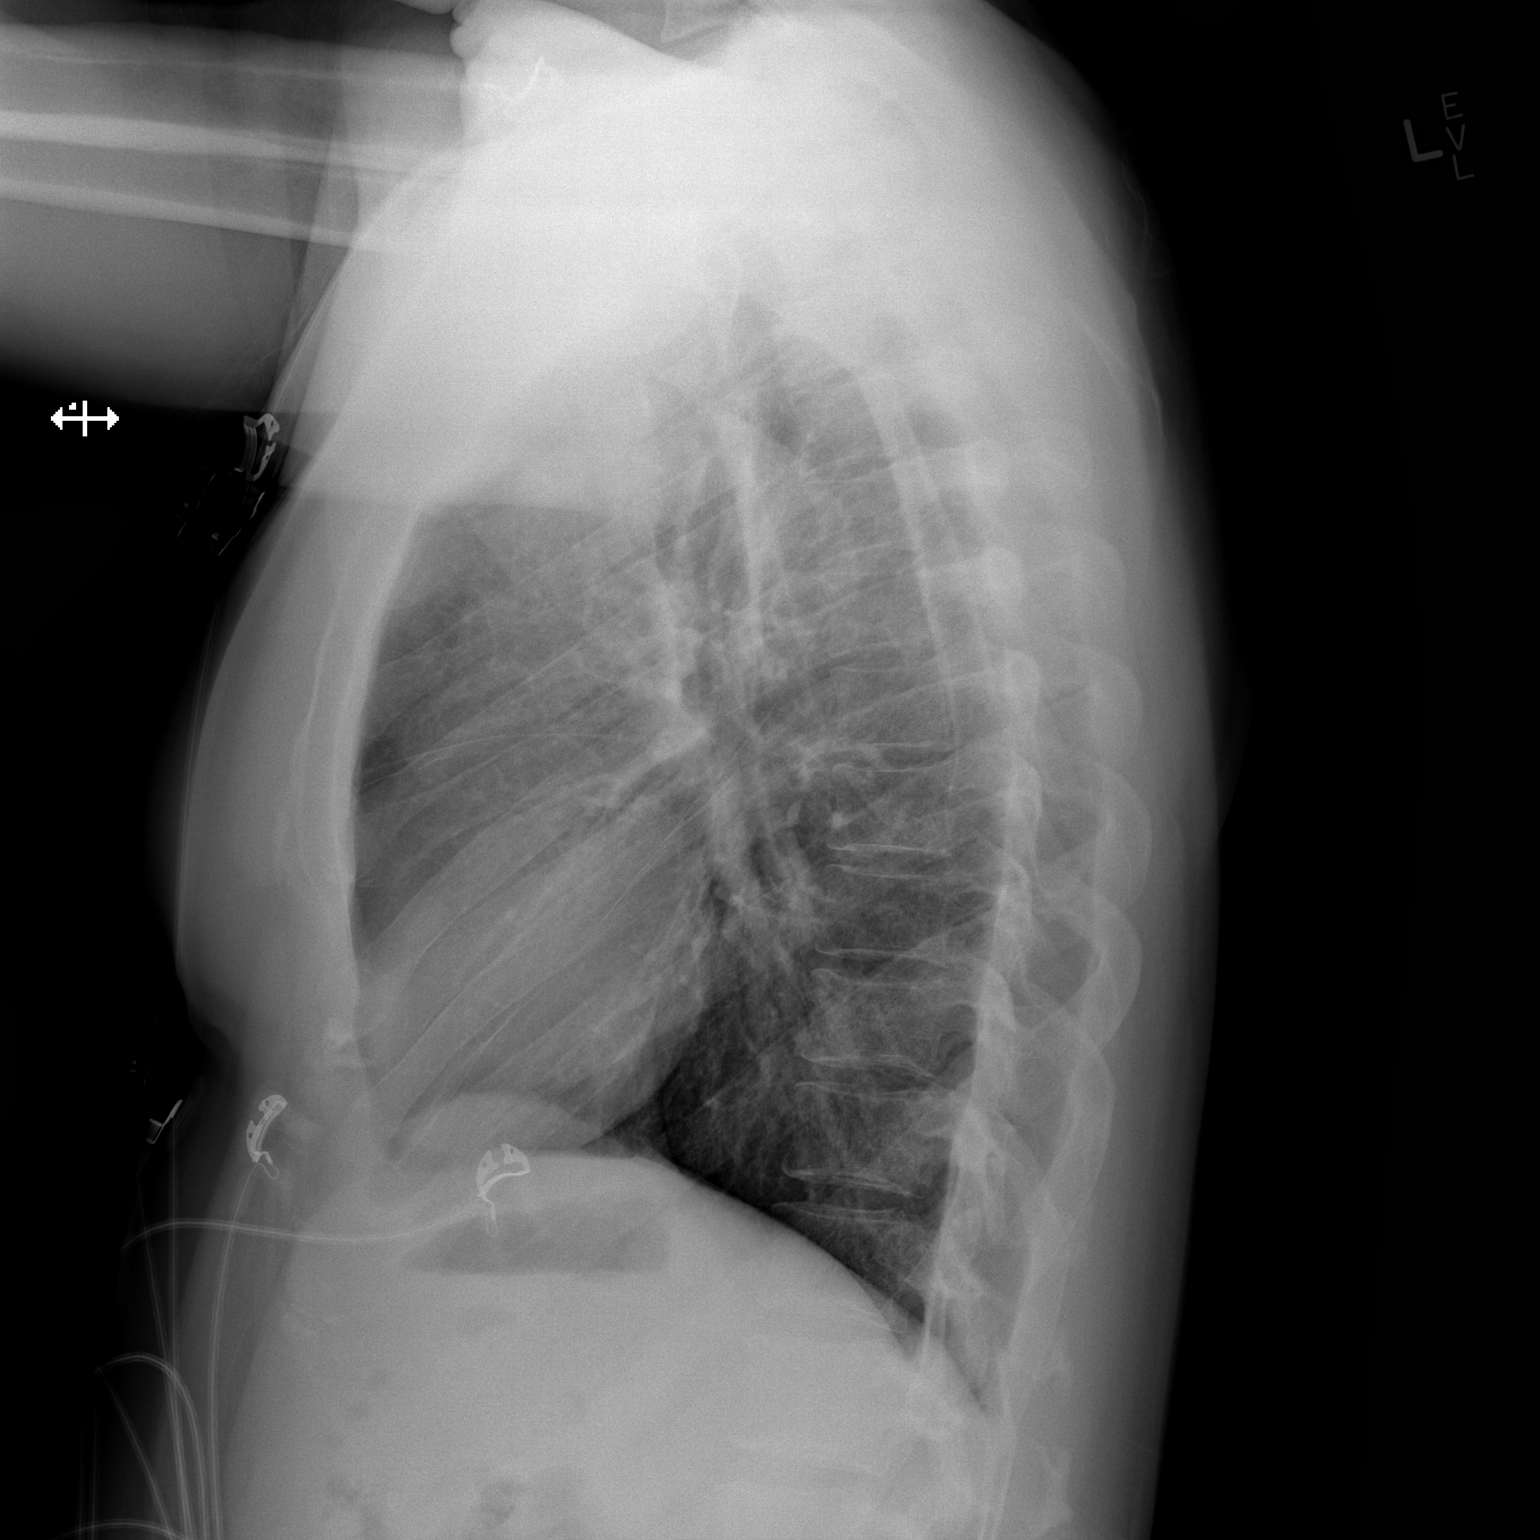

[2 of 2 positions shown; findings below may reference images not displayed]

FINDINGS: The cardiomediastinal silhouette is within normal limits. The lungs
are well inflated and clear. There is no evidence of pleural
effusion or pneumothorax. No acute osseous abnormality is
identified.
IMPRESSION: No active cardiopulmonary disease.
